# Patient Record
Sex: Male | Born: 1980 | Race: White | Hispanic: No | Smoking: Former smoker
Health system: Southern US, Community
[De-identification: ages and names within clinical notes are randomized; demographics above are authoritative.]

## PROBLEM LIST (undated history)

## (undated) DIAGNOSIS — G44009 Cluster headache syndrome, unspecified, not intractable: Secondary | ICD-10-CM

## (undated) DIAGNOSIS — K200 Eosinophilic esophagitis: Secondary | ICD-10-CM

## (undated) HISTORY — DX: Cluster headache syndrome, unspecified, not intractable: G44.009

## (undated) HISTORY — DX: Eosinophilic esophagitis: K20.0

---

## 2015-02-16 ENCOUNTER — Encounter: Payer: Self-pay | Admitting: Podiatry

## 2015-02-16 ENCOUNTER — Ambulatory Visit (INDEPENDENT_AMBULATORY_CARE_PROVIDER_SITE_OTHER): Admitting: Podiatry

## 2015-02-16 VITALS — BP 151/99 | HR 81 | Resp 18

## 2015-02-16 DIAGNOSIS — Z79899 Other long term (current) drug therapy: Secondary | ICD-10-CM

## 2015-02-16 DIAGNOSIS — B353 Tinea pedis: Secondary | ICD-10-CM | POA: Diagnosis not present

## 2015-02-16 DIAGNOSIS — B351 Tinea unguium: Secondary | ICD-10-CM | POA: Diagnosis not present

## 2015-02-16 LAB — CBC WITH DIFFERENTIAL/PLATELET
BASOS ABS: 0 10*3/uL (ref 0.0–0.1)
BASOS PCT: 0 % (ref 0–1)
Eosinophils Absolute: 0.2 10*3/uL (ref 0.0–0.7)
Eosinophils Relative: 2 % (ref 0–5)
HCT: 43.7 % (ref 39.0–52.0)
HEMOGLOBIN: 15.3 g/dL (ref 13.0–17.0)
Lymphocytes Relative: 23 % (ref 12–46)
Lymphs Abs: 2 10*3/uL (ref 0.7–4.0)
MCH: 31.4 pg (ref 26.0–34.0)
MCHC: 35 g/dL (ref 30.0–36.0)
MCV: 89.7 fL (ref 78.0–100.0)
MPV: 9.3 fL (ref 8.6–12.4)
Monocytes Absolute: 0.9 10*3/uL (ref 0.1–1.0)
Monocytes Relative: 10 % (ref 3–12)
NEUTROS ABS: 5.6 10*3/uL (ref 1.7–7.7)
NEUTROS PCT: 65 % (ref 43–77)
Platelets: 213 10*3/uL (ref 150–400)
RBC: 4.87 MIL/uL (ref 4.22–5.81)
RDW: 13.6 % (ref 11.5–15.5)
WBC: 8.6 10*3/uL (ref 4.0–10.5)

## 2015-02-16 LAB — HEPATIC FUNCTION PANEL
ALK PHOS: 42 U/L (ref 40–115)
ALT: 26 U/L (ref 9–46)
AST: 34 U/L (ref 10–40)
Albumin: 4.4 g/dL (ref 3.6–5.1)
BILIRUBIN DIRECT: 0.2 mg/dL (ref <=0.2)
BILIRUBIN TOTAL: 0.8 mg/dL (ref 0.2–1.2)
Indirect Bilirubin: 0.6 mg/dL (ref 0.2–1.2)
Total Protein: 6.5 g/dL (ref 6.1–8.1)

## 2015-02-16 MED ORDER — TERBINAFINE HCL 250 MG PO TABS: 250.0000 mg | ORAL_TABLET | Freq: Every day | ORAL | Status: DC

## 2015-02-16 NOTE — Patient Instructions (Signed)

## 2015-02-16 NOTE — Progress Notes (Signed)
   Subjective:    Patient ID: Tristan Davenport, male    DOB: Jan 21, 1981, 34 y.o.   MRN: [id number]  HPI  34 year old male presents the also for concerns of toenail fungus as well as thickening to his toenails. He denies any pain to the toenails and denies any surrounding redness or drainage. He previously was on a medicine for about 1 week previously. He discontinued the medicine although he was not having any side effects of the medicine. Since been over several years ago. He states his feet used it quite a bit however that subsided but he does continue to have a rash in the bottom of both of his feet. No open lesions or sores or any drainage. No other complaints at this time.   Review of Systems  All other systems reviewed and are negative.      Objective:   Physical Exam General: AAO x3, NAD  Dermatological: Nails are hypertrophic, dystrophic, brittle, discolored protective of the bilateral hallux as well as second digit toenails. There is also dry, scaly, erythematous rash interdigitally as well as the plantar aspect of the foot. There is no drainage or pus or any signs of infection.  Vascular: Dorsalis Pedis artery and Posterior Tibial artery pedal pulses are 2/4 bilateral with immedate capillary fill time. Pedal hair growth present. No varicosities and no lower extremity edema present bilateral. There is no pain with calf compression, swelling, warmth, erythema.   Neruologic: Grossly intact via light touch bilateral. Vibratory intact via tuning fork bilateral. Protective threshold with Semmes Wienstein monofilament intact to all pedal sites bilateral. Patellar and Achilles deep tendon reflexes 2+ bilateral. No Babinski or clonus noted bilateral.   Musculoskeletal: No gross boney pedal deformities bilateral. No pain, crepitus, or limitation noted with foot and ankle range of motion bilateral. Muscular strength 5/5 in all groups tested bilateral.  Gait: Unassisted, Nonantalgic.        Assessment & Plan:  34 year old male with onychodystrophy, onychomycosis/tinea pedis -Treatment options discussed including all alternatives, risks, and complications -At today's appointment the nails were biopsied and [facility] of onychomycosis. -Discussed nail removal however they are not painful at this time or infected. He wishes to hold off on nail removal. He was inquiring about this however. -Due to the tinea pedis we'll go ahead and start Lamisil. Prescription for 30 days of Lamisil as well as blood work was given the patient. Do not start the pills until I call him with the results the blood work. Discussed with him that he should not drink while taking this medicine. I also discussed side effects the medicine to stop and call the office should any occur. -If the culture changes the course of therapy I will call the patient however due to the amount of tinea pedis will come and start the Lamisil. -Follow-up 4 weeks after starting the lamisil or sooner if any problems arise. In the meantime, encouraged to call the office with any questions, concerns, change in symptoms.   Ovid CurdMatthew Wagoner, DPM

## 2015-02-18 ENCOUNTER — Encounter: Payer: Self-pay | Admitting: Podiatry

## 2015-02-18 DIAGNOSIS — B353 Tinea pedis: Secondary | ICD-10-CM | POA: Insufficient documentation

## 2015-02-19 ENCOUNTER — Telehealth: Payer: Self-pay | Admitting: *Deleted

## 2015-02-19 NOTE — Telephone Encounter (Addendum)
-----   Message from Vivi BarrackMatthew R Wagoner, DPM sent at 02/17/2015 11:02 AM EST ----- OK to start lamisil. Blood work normal. Please let him know. Thanks. Left message instructing pt to begin medication.

## 2015-03-14 ENCOUNTER — Ambulatory Visit
Admission: RE | Admit: 2015-03-14 | Discharge: 2015-03-14 | Disposition: A | Discharge status: Home / Self Care | Payer: No Typology Code available for payment source | Source: Ambulatory Visit | Attending: Occupational Medicine | Admitting: Occupational Medicine

## 2015-03-14 ENCOUNTER — Other Ambulatory Visit: Payer: Self-pay | Admitting: Occupational Medicine

## 2015-03-14 DIAGNOSIS — Z09 Encounter for follow-up examination after completed treatment for conditions other than malignant neoplasm: Secondary | ICD-10-CM

## 2015-03-16 ENCOUNTER — Encounter: Payer: Self-pay | Admitting: Podiatry

## 2015-03-16 ENCOUNTER — Ambulatory Visit (INDEPENDENT_AMBULATORY_CARE_PROVIDER_SITE_OTHER): Admitting: Podiatry

## 2015-03-16 VITALS — BP 124/90 | HR 63 | Resp 18

## 2015-03-16 DIAGNOSIS — B351 Tinea unguium: Secondary | ICD-10-CM

## 2015-03-16 DIAGNOSIS — Z79899 Other long term (current) drug therapy: Secondary | ICD-10-CM | POA: Diagnosis not present

## 2015-03-16 LAB — CBC WITH DIFFERENTIAL/PLATELET
BASOS ABS: 0 10*3/uL (ref 0.0–0.1)
BASOS PCT: 0 % (ref 0–1)
EOS ABS: 0.2 10*3/uL (ref 0.0–0.7)
Eosinophils Relative: 3 % (ref 0–5)
HCT: 47.3 % (ref 39.0–52.0)
HEMOGLOBIN: 16.6 g/dL (ref 13.0–17.0)
Lymphocytes Relative: 32 % (ref 12–46)
Lymphs Abs: 1.9 10*3/uL (ref 0.7–4.0)
MCH: 31.2 pg (ref 26.0–34.0)
MCHC: 35.1 g/dL (ref 30.0–36.0)
MCV: 88.9 fL (ref 78.0–100.0)
MONOS PCT: 8 % (ref 3–12)
MPV: 9.1 fL (ref 8.6–12.4)
Monocytes Absolute: 0.5 10*3/uL (ref 0.1–1.0)
NEUTROS ABS: 3.4 10*3/uL (ref 1.7–7.7)
NEUTROS PCT: 57 % (ref 43–77)
PLATELETS: 237 10*3/uL (ref 150–400)
RBC: 5.32 MIL/uL (ref 4.22–5.81)
RDW: 13.4 % (ref 11.5–15.5)
WBC: 6 10*3/uL (ref 4.0–10.5)

## 2015-03-16 LAB — HEPATIC FUNCTION PANEL
ALBUMIN: 4.6 g/dL (ref 3.6–5.1)
ALK PHOS: 40 U/L (ref 40–115)
ALT: 26 U/L (ref 9–46)
AST: 27 U/L (ref 10–40)
BILIRUBIN DIRECT: 0.1 mg/dL (ref <=0.2)
BILIRUBIN INDIRECT: 0.4 mg/dL (ref 0.2–1.2)
BILIRUBIN TOTAL: 0.5 mg/dL (ref 0.2–1.2)
Total Protein: 7 g/dL (ref 6.1–8.1)

## 2015-03-16 LAB — BASIC METABOLIC PANEL
BUN: 17 mg/dL (ref 7–25)
CALCIUM: 9.7 mg/dL (ref 8.6–10.3)
CHLORIDE: 100 mmol/L (ref 98–110)
CO2: 28 mmol/L (ref 20–31)
CREATININE: 1.14 mg/dL (ref 0.60–1.35)
GLUCOSE: 78 mg/dL (ref 65–99)
Potassium: 4.9 mmol/L (ref 3.5–5.3)
Sodium: 138 mmol/L (ref 135–146)

## 2015-03-16 MED ORDER — TERBINAFINE HCL 250 MG PO TABS: 250.0000 mg | ORAL_TABLET | Freq: Every day | ORAL | Status: DC

## 2015-03-19 ENCOUNTER — Telehealth: Payer: Self-pay | Admitting: *Deleted

## 2015-03-19 ENCOUNTER — Encounter: Payer: Self-pay | Admitting: Podiatry

## 2015-03-19 DIAGNOSIS — B351 Tinea unguium: Secondary | ICD-10-CM | POA: Insufficient documentation

## 2015-03-19 NOTE — Progress Notes (Signed)
Patient ID: Starla LinkJeffrey Leavey, male   DOB: May 29, 1980, 34 y.o.   MRN: [id number]  Subjective: 34 year old male presents the office today 4 weeks of the started Lamisil. He states that other than being thirsty he has had no other concerns of the medication. He states he can tell a difference with the medicine. Denies any pain that he can see evidence of clearing of the nails. No drainage from the nails or any redness. No other complaints.  Objective: AAO 3, NAD Neurovascular status intact There does appear to be hypertrophy, dystrophy, discoloration, brittleness the nails distally however there is clearing the nails proximally with approximately 25% the nail cleared proximally. There is no tenderness palpation of the nails and no swelling erythema or drainage. No open lesions or pre-ulcerative lesions. No pain with cal compression, swelling, warmth, erythema.  Assessment: 34 year old male with resolving onychomycosis  Plan: -Treatment options discussed including all alternatives, risks, and complications -At this time discussed other side effects of Lamisil. He wishes to continue the Lamisil. Prescription for blood work was also given to him today as well as for 60 days and Lamisil. Encouraged him not to take the Lamisil and I call him with results of the blood work. -Follow-up in 2 months or sooner if any problems arise. In the meantime, encouraged to call the office with any questions, concerns, change in symptoms.   Ovid CurdMatthew Curvin Hunger, DPM

## 2015-03-19 NOTE — Telephone Encounter (Addendum)
-----   Message from Vivi BarrackMatthew R Wagoner, DPM sent at 03/19/2015  7:54 AM EST ----- All labs normal. Can continue lamisil. Please let him know. Thanks. Informed pt of results.

## 2015-04-30 ENCOUNTER — Ambulatory Visit (INDEPENDENT_AMBULATORY_CARE_PROVIDER_SITE_OTHER): Admitting: Podiatry

## 2015-04-30 ENCOUNTER — Encounter: Payer: Self-pay | Admitting: Podiatry

## 2015-04-30 VITALS — BP 171/105 | HR 87 | Resp 18

## 2015-04-30 DIAGNOSIS — Z79899 Other long term (current) drug therapy: Secondary | ICD-10-CM

## 2015-04-30 DIAGNOSIS — B351 Tinea unguium: Secondary | ICD-10-CM | POA: Diagnosis not present

## 2015-04-30 MED ORDER — TERBINAFINE HCL 250 MG PO TABS: 250.0000 mg | ORAL_TABLET | Freq: Every day | ORAL | Status: DC

## 2015-04-30 NOTE — Progress Notes (Signed)
Patient ID: Tristan Davenport, male   DOB: 1980-09-23, 35 y.o.   MRN: [id number]  Subjective: 35 year old male presents the office today 4 weeks of the started Lamisil. He states he said no side effects the medication. The thirstiness that he is having previously has resolved. He states that he has noticed significant improvement in his nails. Denies any pain or any ascending redness or drainage. No other complaints.  Objective: AAO 3, NAD Neurovascular status intact There does appear to be hypertrophy, dystrophy, discoloration, brittleness the nails distally however there is clearing the nails proximally with approximately 50% the nail cleared proximally. There is no tenderness palpation of the nails and no swelling erythema or drainage. No open lesions or pre-ulcerative lesions. No pain with cal compression, swelling, warmth, erythema.  Assessment: 35 year old male with resolving onychomycosis  Plan: -Treatment options discussed including all alternatives, risks, and complications -At this time discussed other side effects of Lamisil. He wishes to continue the Lamisil. We'll continue Lamisil for 4 months. We'll repeat blood work as well as the perception for 30 days of Lamisil was dispensed. Recommend he get the blood work about one week before starting the fourth month. Continue to monitor for side effects. -Due to his schedule see him back in the next couple months. In the meantime call the office with any questions, concerns or any change in symptoms.  Ovid Curd, DPM

## 2016-03-31 DIAGNOSIS — K200 Eosinophilic esophagitis: Secondary | ICD-10-CM

## 2016-03-31 HISTORY — DX: Eosinophilic esophagitis: K20.0

## 2016-04-28 ENCOUNTER — Emergency Department (HOSPITAL_COMMUNITY)
Admission: EM | Admit: 2016-04-28 | Discharge: 2016-04-28 | Disposition: A | Discharge status: Home / Self Care | Payer: No Typology Code available for payment source | Attending: Dermatology | Admitting: Dermatology

## 2016-04-28 ENCOUNTER — Encounter (HOSPITAL_COMMUNITY): Payer: Self-pay | Admitting: Emergency Medicine

## 2016-04-28 DIAGNOSIS — Z5321 Procedure and treatment not carried out due to patient leaving prior to being seen by health care provider: Secondary | ICD-10-CM | POA: Insufficient documentation

## 2016-04-28 DIAGNOSIS — R1310 Dysphagia, unspecified: Secondary | ICD-10-CM | POA: Insufficient documentation

## 2016-04-28 NOTE — ED Triage Notes (Signed)
Pt states he was eating steak and "it went down the wrong pipe"; pt states he was having respiratory distress until he was able to vomit; pt stated the respiratory distress recurred and he vomited again; pt states he has had some difficulties with swallowing that are new in the past few weeks; A&Ox4; no distress at this time; breathing freely and maintaining O2 sats

## 2016-04-28 NOTE — ED Notes (Signed)
Pt informed registration he is leaving facility

## 2016-05-05 ENCOUNTER — Ambulatory Visit: Payer: No Typology Code available for payment source | Admitting: Physician Assistant

## 2016-05-06 ENCOUNTER — Ambulatory Visit (INDEPENDENT_AMBULATORY_CARE_PROVIDER_SITE_OTHER): Payer: Commercial Managed Care - HMO | Admitting: Physician Assistant

## 2016-05-06 ENCOUNTER — Encounter: Payer: Self-pay | Admitting: Physician Assistant

## 2016-05-06 VITALS — BP 155/100 | HR 90 | Temp 98.5°F | Resp 20 | Ht 72.0 in | Wt 200.0 lb

## 2016-05-06 DIAGNOSIS — Z1339 Encounter for screening examination for other mental health and behavioral disorders: Secondary | ICD-10-CM

## 2016-05-06 DIAGNOSIS — Z0001 Encounter for general adult medical examination with abnormal findings: Secondary | ICD-10-CM

## 2016-05-06 DIAGNOSIS — R1310 Dysphagia, unspecified: Secondary | ICD-10-CM | POA: Diagnosis not present

## 2016-05-06 DIAGNOSIS — Z789 Other specified health status: Secondary | ICD-10-CM | POA: Diagnosis not present

## 2016-05-06 DIAGNOSIS — Z1389 Encounter for screening for other disorder: Secondary | ICD-10-CM | POA: Diagnosis not present

## 2016-05-06 DIAGNOSIS — E663 Overweight: Secondary | ICD-10-CM | POA: Diagnosis not present

## 2016-05-06 DIAGNOSIS — N50819 Testicular pain, unspecified: Secondary | ICD-10-CM | POA: Diagnosis not present

## 2016-05-06 DIAGNOSIS — R030 Elevated blood-pressure reading, without diagnosis of hypertension: Secondary | ICD-10-CM | POA: Diagnosis not present

## 2016-05-06 DIAGNOSIS — Z23 Encounter for immunization: Secondary | ICD-10-CM

## 2016-05-06 DIAGNOSIS — Z0000 Encounter for general adult medical examination without abnormal findings: Secondary | ICD-10-CM | POA: Diagnosis not present

## 2016-05-06 LAB — CBC WITH DIFFERENTIAL/PLATELET
BASOS PCT: 0.8 % (ref 0.0–3.0)
Basophils Absolute: 0 10*3/uL (ref 0.0–0.1)
EOS PCT: 3.1 % (ref 0.0–5.0)
Eosinophils Absolute: 0.2 10*3/uL (ref 0.0–0.7)
HEMATOCRIT: 46.9 % (ref 39.0–52.0)
HEMOGLOBIN: 16.3 g/dL (ref 13.0–17.0)
LYMPHS PCT: 22.6 % (ref 12.0–46.0)
Lymphs Abs: 1.3 10*3/uL (ref 0.7–4.0)
MCHC: 34.9 g/dL (ref 30.0–36.0)
MCV: 91.5 fl (ref 78.0–100.0)
MONOS PCT: 9.2 % (ref 3.0–12.0)
Monocytes Absolute: 0.5 10*3/uL (ref 0.1–1.0)
Neutro Abs: 3.6 10*3/uL (ref 1.4–7.7)
Neutrophils Relative %: 64.3 % (ref 43.0–77.0)
PLATELETS: 234 10*3/uL (ref 150.0–400.0)
RBC: 5.12 Mil/uL (ref 4.22–5.81)
RDW: 13.3 % (ref 11.5–15.5)
WBC: 5.6 10*3/uL (ref 4.0–10.5)

## 2016-05-06 LAB — LIPID PANEL
CHOL/HDL RATIO: 3
Cholesterol: 222 mg/dL — ABNORMAL HIGH (ref 0–200)
HDL: 73.6 mg/dL (ref >39.00)
LDL Cholesterol: 135 mg/dL — ABNORMAL HIGH (ref 0–99)
NONHDL: 148.5
TRIGLYCERIDES: 68 mg/dL (ref 0.0–149.0)
VLDL: 13.6 mg/dL (ref 0.0–40.0)

## 2016-05-06 LAB — COMPREHENSIVE METABOLIC PANEL
ALBUMIN: 5 g/dL (ref 3.5–5.2)
ALT: 29 U/L (ref 0–53)
AST: 27 U/L (ref 0–37)
Alkaline Phosphatase: 47 U/L (ref 39–117)
BUN: 22 mg/dL (ref 6–23)
CALCIUM: 9.8 mg/dL (ref 8.4–10.5)
CHLORIDE: 106 meq/L (ref 96–112)
CO2: 26 meq/L (ref 19–32)
CREATININE: 1.1 mg/dL (ref 0.40–1.50)
GFR: 79.79 mL/min (ref >60.00)
Glucose, Bld: 88 mg/dL (ref 70–99)
Potassium: 4.4 mEq/L (ref 3.5–5.1)
Sodium: 137 mEq/L (ref 135–145)
Total Bilirubin: 0.6 mg/dL (ref 0.2–1.2)
Total Protein: 7.3 g/dL (ref 6.0–8.3)

## 2016-05-06 NOTE — Progress Notes (Signed)
Pre visit review using our clinic review tool, if applicable. No additional management support is needed unless otherwise documented below in the visit note. 

## 2016-05-06 NOTE — Patient Instructions (Addendum)
It was great meeting you today!  You will be called about scheduling your testicular ultrasound and your gastroenterology referral. Please avoid difficult to chew foods, as we discussed until cleared by gastroenterology. If you develop any difficulty breathing, please go to the emergency room immediately!  Please make an appointment to follow-up with me in two weeks for your blood pressure. Work on stress management if you can!   Health Maintenance, Male A healthy lifestyle and preventative care can promote health and wellness.  Maintain regular health, dental, and eye exams.  Eat a healthy diet. Foods like vegetables, fruits, whole grains, low-fat dairy products, and lean protein foods contain the nutrients you need and are low in calories. Decrease your intake of foods high in solid fats, added sugars, and salt. Get information about a proper diet from your health care provider, if necessary.  Regular physical exercise is one of the most important things you can do for your health. Most adults should get at least 150 minutes of moderate-intensity exercise (any activity that increases your heart rate and causes you to sweat) each week. In addition, most adults need muscle-strengthening exercises on 2 or more days a week.   Maintain a healthy weight. The body mass index (BMI) is a screening tool to identify possible weight problems. It provides an estimate of body fat based on height and weight. Your health care provider can find your BMI and can help you achieve or maintain a healthy weight. For males 20 years and older:  A BMI below 18.5 is considered underweight.  A BMI of 18.5 to 24.9 is normal.  A BMI of 25 to 29.9 is considered overweight.  A BMI of 30 and above is considered obese.  Maintain normal blood lipids and cholesterol by exercising and minimizing your intake of saturated fat. Eat a balanced diet with plenty of fruits and vegetables. Blood tests for lipids and cholesterol  should begin at age 12 and be repeated every 5 years. If your lipid or cholesterol levels are high, you are over age 39, or you are at high risk for heart disease, you may need your cholesterol levels checked more frequently.Ongoing high lipid and cholesterol levels should be treated with medicines if diet and exercise are not working.  If you smoke, find out from your health care provider how to quit. If you do not use tobacco, do not start.  Lung cancer screening is recommended for adults aged 55-80 years who are at high risk for developing lung cancer because of a history of smoking. A yearly low-dose CT scan of the lungs is recommended for people who have at least a 30-pack-year history of smoking and are current smokers or have quit within the past 15 years. A pack year of smoking is smoking an average of 1 pack of cigarettes a day for 1 year (for example, a 30-pack-year history of smoking could mean smoking 1 pack a day for 30 years or 2 packs a day for 15 years). Yearly screening should continue until the smoker has stopped smoking for at least 15 years. Yearly screening should be stopped for people who develop a health problem that would prevent them from having lung cancer treatment.  If you choose to drink alcohol, do not have more than 2 drinks per day. One drink is considered to be 12 oz (360 mL) of beer, 5 oz (150 mL) of wine, or 1.5 oz (45 mL) of liquor.  Avoid the use of street drugs. Do not  share needles with anyone. Ask for help if you need support or instructions about stopping the use of drugs.  High blood pressure causes heart disease and increases the risk of stroke. High blood pressure is more likely to develop in:  People who have blood pressure in the end of the normal range (100-139/85-89 mm Hg).  People who are overweight or obese.  People who are African American.  If you are 5918-38 years of age, have your blood pressure checked every 3-5 years. If you are 38 years of age  or older, have your blood pressure checked every year. You should have your blood pressure measured twice-once when you are at a hospital or clinic, and once when you are not at a hospital or clinic. Record the average of the two measurements. To check your blood pressure when you are not at a hospital or clinic, you can use:  An automated blood pressure machine at a pharmacy.  A home blood pressure monitor.  If you are 3945-38 years old, ask your health care provider if you should take aspirin to prevent heart disease.  Diabetes screening involves taking a blood sample to check your fasting blood sugar level. This should be done once every 3 years after age 38 if you are at a normal weight and without risk factors for diabetes. Testing should be considered at a younger age or be carried out more frequently if you are overweight and have at least 1 risk factor for diabetes.  Colorectal cancer can be detected and often prevented. Most routine colorectal cancer screening begins at the age of 38 and continues through age 38. However, your health care provider may recommend screening at an earlier age if you have risk factors for colon cancer. On a yearly basis, your health care provider may provide home test kits to check for hidden blood in the stool. A small camera at the end of a tube may be used to directly examine the colon (sigmoidoscopy or colonoscopy) to detect the earliest forms of colorectal cancer. Talk to your health care provider about this at age 38 when routine screening begins. A direct exam of the colon should be repeated every 5-10 years through age 38, unless early forms of precancerous polyps or small growths are found.  People who are at an increased risk for hepatitis B should be screened for this virus. You are considered at high risk for hepatitis B if:  You were born in a country where hepatitis B occurs often. Talk with your health care provider about which countries are considered  high risk.  Your parents were born in a high-risk country and you have not received a shot to protect against hepatitis B (hepatitis B vaccine).  You have HIV or AIDS.  You use needles to inject street drugs.  You live with, or have sex with, someone who has hepatitis B.  You are a man who has sex with other men (MSM).  You get hemodialysis treatment.  You take certain medicines for conditions like cancer, organ transplantation, and autoimmune conditions.  Hepatitis C blood testing is recommended for all people born from [id number] through 1965 and any individual with known risk factors for hepatitis C.  Healthy men should no longer receive prostate-specific antigen (PSA) blood tests as part of routine cancer screening. Talk to your health care provider about prostate cancer screening.  Testicular cancer screening is not recommended for adolescents or adult males who have no symptoms. Screening includes self-exam, a health  care provider exam, and other screening tests. Consult with your health care provider about any symptoms you have or any concerns you have about testicular cancer.  Practice safe sex. Use condoms and avoid high-risk sexual practices to reduce the spread of sexually transmitted infections (STIs).  You should be screened for STIs, including gonorrhea and chlamydia if:  You are sexually active and are younger than 24 years.  You are older than 24 years, and your health care provider tells you that you are at risk for this type of infection.  Your sexual activity has changed since you were last screened, and you are at an increased risk for chlamydia or gonorrhea. Ask your health care provider if you are at risk.  If you are at risk of being infected with HIV, it is recommended that you take a prescription medicine daily to prevent HIV infection. This is called pre-exposure prophylaxis (PrEP). You are considered at risk if:  You are a man who has sex with other men  (MSM).  You are a heterosexual man who is sexually active with multiple partners.  You take drugs by injection.  You are sexually active with a partner who has HIV.  Talk with your health care provider about whether you are at high risk of being infected with HIV. If you choose to begin PrEP, you should first be tested for HIV. You should then be tested every 3 months for as long as you are taking PrEP.  Use sunscreen. Apply sunscreen liberally and repeatedly throughout the day. You should seek shade when your shadow is shorter than you. Protect yourself by wearing long sleeves, pants, a wide-brimmed hat, and sunglasses year round whenever you are outdoors.  Tell your health care provider of new moles or changes in moles, especially if there is a change in shape or color. Also, tell your health care provider if a mole is larger than the size of a pencil eraser.  A one-time screening for abdominal aortic aneurysm (AAA) and surgical repair of large AAAs by ultrasound is recommended for men aged 65-75 years who are current or former smokers.  Stay current with your vaccines (immunizations). This information is not intended to replace advice given to you by your health care provider. Make sure you discuss any questions you have with your health care provider. Document Released: 09/13/2007 Document Revised: 04/07/2014 Document Reviewed: 12/19/2014 Elsevier Interactive Patient Education  2017 [employer].

## 2016-05-06 NOTE — Progress Notes (Signed)
Subjective:    Patient ID: Tristan Davenport, male    DOB: 1980/05/25, 36 y.o.   MRN: [id number]  HPI  Mr. Tristan Davenport is a 36 y/o male who presents to establish care.   Acute concerns 1) Choking - he is having some issues with "choking" x 2 episodes. Of note, he went to the ED on 04/28/2016 after an episode where he was eating steak and "it went down the wrong pipe."  During that episode he reports that he was having difficulty breathing until he was able to vomit. This is the second time that this has happened, the first time it was with sausage.  He does not have issues with liquids. He has noticed an increase in difficulty swallowing x 2 months. He denies any weight loss or symptoms suggesting anemia (lightheadedness, dizziness.) He does state that he has had some recent (but not current) issues with external hemorrhoids, and had some BRBPR a few months ago, but that has resolved. He does also note that he thinks he is developing food allergies to soy, avocado and beans, he states that over the past few months/weeks he has noticed when he eats these foods he feels like his throat is itchy and sometimes he feels like his throat may "close." 2) "Groin issue" - he states that he has had some intermittent discomfort and fullness to his R testicle x 2 years. Denies trauma. Denies pain with lifting or any discoloration. Describes his R scrotum as feeling like "spaghetti." He has not felt any masses.  Chronic Issues: 1) Hypertension -- he was diagnosed with HTN at age 89. He was on medications for 3 weeks, but the medicine did not make him feel good, he does not remember how he felt or what the name of the medication was; he states that it was 141/91 last week when checked by someone at work. He tells me that he is very against medications for HTN. He denies changes in vision, unusual chest pain, swelling in his legs, SOB. 2) Alcohol abuse -  he tells me that he used to be an alcoholic, but never went to  rehab and was able to decrease his alcohol intake without any professional help; he tells me now that he drinks at least 12 drinks a week, drinks sctoch, rum, or gin; tells me that no one has ever told him to cut down on his drinking and that he has no concerns with it at this time 3) Hx of cluster headaches - it has been at least 3-5 years since his last cluster HA  Health Maintenance: Immunizations -- due for tetanus, he will bring records Diet -- well balanced, thinking of going to paleo soon Exercise -- 6-7 a days a week, weights and cardio Weight -- Weight: 200 lb (90.7 kg) , 195 lb a week ago (muscle growth), 185 lb 1 year ago Mood -- does have mild depression  Other providers/specialists: Dentist -- he has routine check-up, does not know the name of his doctor No eye doctor - lasik 2005 Chiropractor - sprained back in July, sees chiropractor for this   Review of Systems  Constitutional: Negative for fatigue and fever.  HENT: Negative for postnasal drip, sneezing and sore throat.   Eyes: Negative for visual disturbance.  Respiratory: Positive for choking. Negative for cough, chest tightness and shortness of breath.   Cardiovascular: Negative for chest pain, palpitations and leg swelling.  Gastrointestinal: Negative for diarrhea and rectal pain.  Genitourinary: Positive for scrotal swelling  and testicular pain. Negative for difficulty urinating, discharge and hematuria.  Musculoskeletal: Negative for myalgias.  Neurological: Negative for dizziness, seizures and weakness.  Hematological: Negative for adenopathy.  Psychiatric/Behavioral: Negative for decreased concentration and sleep disturbance. The patient is not nervous/anxious.        Denies issues with anxiety   Past Medical History:  Diagnosis Date  . Cluster headaches    last one was 5 year  . Hypertension      Social History   Social History  . Marital status: Divorced    Spouse name: N/A  . Number of children:  N/A  . Years of education: N/A   Occupational History  . Not on file.   Social History Main Topics  . Smoking status: Former Smoker    Types: Cigarettes    Quit date: 05/07/2007  . Smokeless tobacco: Former Neurosurgeon    Types: Snuff    Quit date: 05/06/2013  . Alcohol use 7.2 oz/week    12 Shots of liquor per week  . Drug use: No  . Sexual activity: Yes    Partners: Female   Other Topics Concern  . 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  Family History  Problem Relation Age of Onset  . Hypertension Father   . Anorexia nervosa Sister   . Parkinson's disease Maternal Grandfather   . Emphysema Paternal Grandfather     Allergies  Allergen Reactions  . Penicillins     No current outpatient prescriptions on file prior to visit.   No current facility-administered medications on file prior to visit.     BP (!) 160/100 (BP Location: Left Arm, Patient Position: Sitting, Cuff Size: Large)   Pulse 90   Temp 98.5 F (36.9 C) (Oral)   Resp 20   Ht 6' (1.829 m)   Wt 200 lb (90.7 kg)   SpO2 97%   BMI 27.12 kg/m        Objective:   Physical Exam  Constitutional: He appears well-developed and well-nourished. He is cooperative.  Non-toxic appearance. He does not have a sickly appearance. He does not appear ill.  HENT:  Head: Normocephalic and atraumatic.  Right Ear: Tympanic membrane, external ear and ear canal normal. Tympanic membrane is not erythematous, not retracted and not bulging.  Left Ear: Tympanic membrane, external ear and ear canal normal. Tympanic membrane is not erythematous, not retracted and not bulging.  Nose: Nose normal. Right sinus exhibits no maxillary sinus tenderness and no frontal sinus tenderness. Left sinus exhibits no maxillary sinus tenderness and no frontal  sinus tenderness.  Mouth/Throat: Uvula is midline. No posterior oropharyngeal edema or posterior oropharyngeal erythema.  Cardiovascular: Normal rate, regular rhythm and normal heart sounds.   Pulmonary/Chest: Effort normal and breath sounds normal. No accessory muscle usage. No respiratory distress.  Abdominal: Soft. Normal appearance and bowel sounds are normal. There is no tenderness. There is no rigidity, no rebound and no guarding.  Genitourinary: Testes normal and penis normal. Right testis shows no mass, no swelling and no tenderness. Left testis shows no mass, no swelling and no tenderness.  Genitourinary Comments: No obvious deformities or enlargement of R testicle; no masses found on either testicle with palpation  Musculoskeletal: Normal range of motion.  Lymphadenopathy:  He has no cervical adenopathy.  Neurological: He is alert. No cranial nerve deficit or sensory deficit. GCS eye subscore is 4. GCS verbal subscore is 5. GCS motor subscore is 6.  Skin: Skin is warm, dry and intact.  Psychiatric: He has a normal mood and affect. His speech is normal and behavior is normal.  Nursing note and vitals reviewed.     Assessment & Plan:  1. Routine adult health maintenance Today patient counseled on age appropriate routine health concerns for screening and prevention, each reviewed and up to date or declined. Immunizations reviewed and up to date or declined. Labs ordered and reviewed. Risk factors for depression reviewed and negative. Hearing function and visual acuity are intact. ADLs screened and addressed as needed. Functional ability and level of safety reviewed and appropriate. Education, counseling and referrals performed based on assessed risks today. Patient provided with a copy of personalized plan for preventive services. - CBC with Differential/Platelet - Lipid panel - Comprehensive metabolic panel - HIV antibody  2. Dysphagia, unspecified type Discussed possible causes.  Recommend referral to GI for further evaluation and treatment. Discouraged further consumption of difficult to chew foods, as well as food products containing those that cause itchiness or any swelling sensation in his throat. - Ambulatory referral to Gastroenterology  3. Testicular discomfort No obvious issues on my exam, however I would like for an ultrasound to be completed given duration of symptoms and intermittent discomfort. Patient agreeable. - US Scrotum; Future - [place] Art/Ven Flow Abd Pelv Doppler; Future  4. Need for prophylactic vaccination with combined diphtheria-tetanus-pertussis (DTP) vaccine Tdap vaccine greater than or equal to 7yo IM  5. Overweight (BMI 25.0-29.9) Patient with muscular body habitus, I encouraged continued exercise and well-balanced meals.  6. Elevated blood pressure reading Patient to follow-up with me in two weeks for blood pressure re-check, sooner if other concerns or problems arise. Patient advised to ED if he develops any sudden onset SOB, chest pain, or other worrisome signs. He is agreeable to considering BP meds if needed.  7. Alcohol consumption of 1-4 drinks per day on alcohol screening Patient does not find that he currently has a problem with his alcohol intake. He does endorse significant life stress. Will continue to monitor. Encouraged patient to follow-up with [place] if alcohol intake increases or becomes problematic. Will address at future visit.  Jarold Motto PA-C 05/07/16

## 2016-05-07 ENCOUNTER — Encounter: Payer: Self-pay | Admitting: Physician Assistant

## 2016-05-07 LAB — HIV ANTIBODY (ROUTINE TESTING W REFLEX): HIV: NONREACTIVE

## 2016-05-09 ENCOUNTER — Encounter: Payer: Self-pay | Admitting: Nurse Practitioner

## 2016-05-09 ENCOUNTER — Ambulatory Visit (INDEPENDENT_AMBULATORY_CARE_PROVIDER_SITE_OTHER): Payer: Commercial Managed Care - HMO | Admitting: Nurse Practitioner

## 2016-05-09 VITALS — BP 112/62 | HR 88 | Ht 72.0 in | Wt 202.0 lb

## 2016-05-09 DIAGNOSIS — R1310 Dysphagia, unspecified: Secondary | ICD-10-CM | POA: Diagnosis not present

## 2016-05-09 MED ORDER — OMEPRAZOLE 40 MG PO CPDR: 40.0000 mg | DELAYED_RELEASE_CAPSULE | ORAL | 3 refills | Status: DC

## 2016-05-09 NOTE — Patient Instructions (Addendum)
If you are age 38 or older, your body mass index should be between 23-30. Your Body mass index is 27.4 kg/m. If this is out of the aforementioned range listed, please consider follow up with your Primary Care Provider.  If you are age 164 or younger, your body mass index should be between 19-25. Your Body mass index is 27.4 kg/m. If this is out of the aformentioned range listed, please consider follow up with your Primary Care Provider.   You have been scheduled for an endoscopy. Please follow written instructions given to you at your visit today. If you use inhalers (even only as needed), please bring them with you on the day of your procedure. Your physician has requested that you go to www.startemmi.com and enter the access code given to you at your visit today. This web site gives a general overview about your procedure. However, you should still follow specific instructions given to you by our office regarding your preparation for the procedure.   Thank you.

## 2016-05-09 NOTE — Progress Notes (Addendum)
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 Esophagus starts to fees tight when eating then it's hard to get solids or liquids down. This is been happening 2-3 times a week  Over the last couple of months. In retrospect he recalls occasional dysphagia to solids for a very long time. He had a recent episode of severe dysphagia when steak lodged in his esophagus resulting in difficulty breathing. Patient went to ED but the wait was long and he eventually vomiting up the steak anyway. Patient took Zantac for transient GERD symptoms years ago. No chest pain. No known pulmonary disease.  He drinks about 3 ETOH beverages a day several days a week. He is trying to cut back on alcohol intake. Recent LFTs were normal.   Past Medical History:  Diagnosis Date  . Cluster headaches    last one was 5 year  . Hypertension     History reviewed. No pertinent surgical history. Family History  Problem Relation Age of Onset  . Stroke Mother 23  . Hypertension Father   . Anorexia nervosa Sister   . Parkinson's disease Maternal Grandfather   . Emphysema Paternal Grandfather   . Colon cancer Neg Hx   . Stomach cancer Neg Hx    Social History  Substance Use Topics  . Smoking status: Former Smoker    Types: Cigarettes    Quit date: 05/07/2007  . Smokeless tobacco: Former Neurosurgeon    Types: Snuff    Quit date: 05/06/2013  . Alcohol use 7.2 oz/week    12 Shots of liquor per week   Current Outpatient Prescriptions  Medication Sig Dispense Refill  . arginine 500 MG tablet Take 500 mg by mouth daily.    Marland Kitchen CITRULLINE PO Take 3 capsules by mouth daily. 250 mg each    . NON FORMULARY      No current facility-administered medications for this visit.    Allergies  Allergen Reactions  . Penicillins      Review of Systems: All systems reviewed and negative except where noted in HPI.    Physical Exam: BP 112/62   Pulse 88   Ht 6' (1.829 m)   Wt  202 lb (91.6 kg)   BMI 27.40 kg/m  Constitutional:  Well-developed, white male in no acute distress. Psychiatric: Normal mood and affect. Behavior is normal. HEENT: Normocephalic and atraumatic. Conjunctivae are normal. No scleral icterus. Neck supple.  Cardiovascular: Normal rate, regular rhythm.  Pulmonary/chest: Effort normal and breath sounds normal. No wheezing, rales or rhonchi. Abdominal: Soft, nondistended, nontender. Bowel sounds active throughout. There are no masses palpable. No hepatomegaly. Extremities: no edema Lymphadenopathy: No cervical adenopathy noted. Neurological: Alert and oriented to person place and time. Skin: Skin is warm and dry. No rashes noted.   ASSESSMENT AND PLAN:  29. 36 year old male with chronic, intermittent solid food dysphagia, worse over the last couple of months. Recent episode of severe dysphagia while eating steak which eventually culminated in regurgitation / vomiting of the steak. Rule out esophageal stricture, eosinophilic esophagitis, dysmotility disorder.  -Patient will be scheduled for EGD with possible dilation. He leaves for [place] in a few days and will return around 05/20/16. I was able to find a procedure time for him with Dr. Rhea Belton within a few days of his return. In the interim I will start him on Omeprazole . We discussed the importance of taking small bites of food slowly with liquids in  between each bite to avoid impaction.  2. ETOH use, more than moderate amount. LFTs normal. He is trying to cut back   Willette ClusterPaula Gabbriella Presswood, NP  05/09/2016, 11:32 AM  Cc:  Jarold MottoWorley, Samantha, GeorgiaPA   Addendum: Reviewed and agree with initial management. Beverley FiedlerJay M Pyrtle, MD

## 2016-05-22 ENCOUNTER — Ambulatory Visit (AMBULATORY_SURGERY_CENTER): Payer: Commercial Managed Care - HMO | Admitting: Internal Medicine

## 2016-05-22 ENCOUNTER — Encounter: Payer: Self-pay | Admitting: Internal Medicine

## 2016-05-22 VITALS — BP 140/78 | HR 72 | Temp 98.9°F | Resp 17 | Ht 72.0 in | Wt 202.0 lb

## 2016-05-22 DIAGNOSIS — K209 Esophagitis, unspecified without bleeding: Secondary | ICD-10-CM

## 2016-05-22 DIAGNOSIS — K200 Eosinophilic esophagitis: Secondary | ICD-10-CM | POA: Diagnosis not present

## 2016-05-22 DIAGNOSIS — R1310 Dysphagia, unspecified: Secondary | ICD-10-CM

## 2016-05-22 DIAGNOSIS — R1319 Other dysphagia: Secondary | ICD-10-CM

## 2016-05-22 MED ORDER — SODIUM CHLORIDE 0.9 % IV SOLN: 500.0000 mL | INTRAVENOUS | Status: DC

## 2016-05-22 MED ORDER — OMEPRAZOLE 40 MG PO CPDR: DELAYED_RELEASE_CAPSULE | ORAL | 3 refills | Status: DC

## 2016-05-22 NOTE — Patient Instructions (Signed)
YOU HAD AN ENDOSCOPIC PROCEDURE TODAY AT THE [facility]:   Refer to the procedure report that was given to you for any specific questions about what was found during the examination.  If the procedure report does not answer your questions, please call your gastroenterologist to clarify.  If you requested that your care partner not be given the details of your procedure findings, then the procedure report has been included in a sealed envelope for you to review at your convenience later.  YOU SHOULD EXPECT: Some feelings of bloating in the abdomen. Passage of more gas than usual.  Walking can help get rid of the air that was put into your GI tract during the procedure and reduce the bloating. If you had a lower endoscopy (such as a colonoscopy or flexible sigmoidoscopy) you may notice spotting of blood in your stool or on the toilet paper. If you underwent a bowel prep for your procedure, you may not have a normal bowel movement for a few days.  Please Note:  You might notice some irritation and congestion in your nose or some drainage.  This is from the oxygen used during your procedure.  There is no need for concern and it should clear up in a day or so.  SYMPTOMS TO REPORT IMMEDIATELY:    Following upper endoscopy (EGD)  Vomiting of blood or coffee ground material  New chest pain or pain under the shoulder blades  Painful or persistently difficult swallowing  New shortness of breath  Fever of 100F or higher  Black, tarry-looking stools  For urgent or emergent issues, a gastroenterologist can be reached at any hour by calling [phone].   DIET:  Follow Post Esophageal Dilation Diet.  Drink plenty of fluids but you should avoid alcoholic beverages for 24 hours.  ACTIVITY:  You should plan to take it easy for the rest of today and you should NOT DRIVE or use heavy machinery until tomorrow (because of the sedation medicines used during the test).    FOLLOW UP: Our staff  will call the number listed on your records the next business day following your procedure to check on you and address any questions or concerns that you may have regarding the information given to you following your procedure. If we do not reach you, we will leave a message.  However, if you are feeling well and you are not experiencing any problems, there is no need to return our call.  We will assume that you have returned to your regular daily activities without incident.  If any biopsies were taken you will be contacted by phone or by letter within the next 1-3 weeks.  Please call us at [phone] if you have not heard about the biopsies in 3 weeks.   Await for biopsy results Returned to clinic in 8-12 weeks for follow and to ensure improvement in dysphagia symptoms. Esophagitis and Stricture (handout given) Post Esophageal Dilation Diet (handout given)   SIGNATURES/CONFIDENTIALITY: You and/or your care partner have signed paperwork which will be entered into your electronic medical record.  These signatures attest to the fact that that the information above on your After Visit Summary has been reviewed and is understood.  Full responsibility of the confidentiality of this discharge information lies with you and/or your care-partner.

## 2016-05-22 NOTE — Progress Notes (Signed)
To recovery, report to Jones, RN, VSS 

## 2016-05-22 NOTE — Op Note (Signed)
[facility] Patient Name: Tristan LinkJeffrey Davenport Procedure Date: 05/22/2016 9:06 AM MRN: [id number] Endoscopist: Beverley FiedlerJay M Cairo Lingenfelter , MD Age: [age] Referring MD:  Date of Birth: 1980/05/24 Gender: Male Account #: [id number] Procedure:                Upper GI endoscopy Indications:              Dysphagia Medicines:                Monitored Anesthesia Care Procedure:                Pre-Anesthesia Assessment:                           - Prior to the procedure, a History and Physical                            was performed, and patient medications and                            allergies were reviewed. The patient's tolerance of                            previous anesthesia was also reviewed. The risks                            and benefits of the procedure and the sedation                            options and risks were discussed with the patient.                            All questions were answered, and informed consent                            was obtained. Prior Anticoagulants: The patient has                            taken no previous anticoagulant or antiplatelet                            agents. ASA Grade Assessment: II - A patient with                            mild systemic disease. After reviewing the risks                            and benefits, the patient was deemed in                            satisfactory condition to undergo the procedure.                           After obtaining informed consent, the endoscope was  passed under direct vision. Throughout the                            procedure, the patient's blood pressure, pulse, and                            oxygen saturations were monitored continuously. The                            Model GIF-HQ190 [id number]) scope was introduced                            through the mouth, and advanced to the second part                            of duodenum. The upper GI endoscopy was                         accomplished without difficulty. The patient                            tolerated the procedure well. Scope In: Scope Out: Findings:                 Mucosal changes including longitudinal furrows and                            congestion (edema) were found in the middle third                            of the esophagus and in the lower third of the                            esophagus. Biopsies were obtained from the proximal                            and distal esophagus with cold forceps for                            histology of suspected eosinophilic esophagitis.                           LA Grade B (one or more mucosal breaks greater than                            5 mm, not extending between the tops of two mucosal                            folds) esophagitis with no bleeding was found 38 to                            40 cm from the incisors. A guidewire was placed and  the scope was withdrawn. Dilation was performed                            with a Savary dilator with mild resistance at 17 mm.                           The cardia and gastric fundus were normal on                            retroflexion.                           Normal mucosa was found in the entire examined                            stomach.                           The examined duodenum was normal. Complications:            No immediate complications. Estimated Blood Loss:     Estimated blood loss was minimal. Impression:               - Esophageal mucosal changes suggestive of                            eosinophilic esophagitis. Biopsied.                           - LA Grade B reflux esophagitis. Dilated.                           - Normal mucosa was found in the entire stomach.                           - Normal examined duodenum. Recommendation:           - Patient has a contact number available for                            emergencies. The signs and  symptoms of potential                            delayed complications were discussed with the                            patient. Return to normal activities tomorrow.                            Written discharge instructions were provided to the                            patient.                           - Resume previous diet.                           -  Continue present medications. Would increase                            omeprazole to 40 mg twice daily before meals x 2                            weeks, then once daily.                           - Await pathology results.                           - Return to clinic in 8-12 weeks for follow-up and                            to ensure improvement in dysphagia symptoms. Beverley Fiedler, MD 05/22/2016 9:36:18 AM This report has been signed electronically.

## 2016-05-22 NOTE — Progress Notes (Signed)
Called to room to assist during endoscopic procedure.  Patient ID and intended procedure confirmed with present staff. Received instructions for my participation in the procedure from the performing physician.  

## 2016-05-23 ENCOUNTER — Ambulatory Visit: Payer: Commercial Managed Care - HMO | Admitting: Physician Assistant

## 2016-05-23 ENCOUNTER — Telehealth: Payer: Self-pay

## 2016-05-23 NOTE — Telephone Encounter (Signed)
Name identifier, left voicemail will call back later today.

## 2016-05-26 ENCOUNTER — Telehealth: Payer: Self-pay | Admitting: Physician Assistant

## 2016-05-26 ENCOUNTER — Telehealth: Payer: Self-pay | Admitting: *Deleted

## 2016-05-26 NOTE — Telephone Encounter (Signed)
Error

## 2016-05-26 NOTE — Telephone Encounter (Signed)
  Follow up Call-  Call back number 05/22/2016  Post procedure Call Back phone  # [phone]  Permission to leave phone message Yes     Patient questions:  Do you have a fever, pain , or abdominal swelling? No. Pain Score  0 *  Have you tolerated food without any problems? Yes.    Have you been able to return to your normal activities? Yes.    Do you have any questions about your discharge instructions: Diet   No. Medications  No. Follow up visit  No.  Do you have questions or concerns about your Care? No.  Actions: * If pain score is 4 or above: No action needed, pain <4.

## 2016-05-27 ENCOUNTER — Ambulatory Visit (INDEPENDENT_AMBULATORY_CARE_PROVIDER_SITE_OTHER): Payer: Commercial Managed Care - HMO | Admitting: Family Medicine

## 2016-05-27 ENCOUNTER — Encounter: Payer: Self-pay | Admitting: Family Medicine

## 2016-05-27 VITALS — BP 150/84 | HR 81 | Temp 98.3°F | Ht 72.0 in | Wt 199.0 lb

## 2016-05-27 DIAGNOSIS — I10 Essential (primary) hypertension: Secondary | ICD-10-CM | POA: Diagnosis not present

## 2016-05-27 MED ORDER — LISINOPRIL 10 MG PO TABS: 10.0000 mg | ORAL_TABLET | Freq: Every day | ORAL | 1 refills | Status: DC

## 2016-05-27 NOTE — Progress Notes (Signed)
Pre visit review using our clinic review tool, if applicable. No additional management support is needed unless otherwise documented below in the visit note. 

## 2016-05-27 NOTE — Progress Notes (Signed)
   Subjective   Tristan Davenport is a 36 y.o. male here to follow up on hypertension.   Wt Readings from Last 3 Encounters:  05/27/16 199 lb (90.3 kg)  05/22/16 202 lb (91.6 kg)  05/09/16 202 lb (91.6 kg)   Reports that he quit smoking cigarettes about 9 years ago.   BP Readings from Last 3 Encounters:  05/27/16 150/84, 144/82  05/22/16 140/78   Lab Results  Component Value Date   CREATININE 1.10 05/06/2016   Review of Systems: No headache, visual changes, nausea, vomiting, diarrhea, constipation, dizziness, abdominal pain, skin rash, fevers, chills, night sweats, weight loss, swollen lymph nodes, body aches, joint swelling, muscle aches, chest pain, shortness of breath, mood changes.   PFMSH, Medications, Allergies reviewed and updated where appropriate.  Objective:     BP (!) 150/84 (BP Location: Left Arm, Cuff Size: Normal)   Pulse 81   Temp 98.3 F (36.8 C) (Oral)   Ht 6' (1.829 m)   Wt 199 lb (90.3 kg)   SpO2 94%   BMI 26.99 kg/m   General appearance: alert, appears stated age and cooperative Lungs: clear to auscultation bilaterally Heart: regular rate and rhythm, S1, S2 normal, no murmur, click, rub or gallop Skin: Skin color, texture, turgor normal. No rashes or lesions Neurologic: Grossly normal  Assessment and plan:   Tristan Davenport was seen today for follow-up.  Diagnoses and all orders for this visit:  Essential hypertension Comments: Patient okay with starting medication today. Orders: -     lisinopril (PRINIVIL,ZESTRIL) 10 MG tablet; Take 1 tablet (10 mg total) by mouth daily.  Medications prescribed today Requested Prescriptions   Signed Prescriptions Disp Refills  . lisinopril (PRINIVIL,ZESTRIL) 10 MG tablet 90 tablet 1    Sig: Take 1 tablet (10 mg total) by mouth daily.    Helane RimaErica Rodolphe Edmonston, D.O. [facility], [facility]

## 2016-05-28 ENCOUNTER — Encounter: Payer: Self-pay | Admitting: Internal Medicine

## 2016-05-28 DIAGNOSIS — K200 Eosinophilic esophagitis: Secondary | ICD-10-CM | POA: Insufficient documentation

## 2016-06-02 ENCOUNTER — Ambulatory Visit
Admission: RE | Admit: 2016-06-02 | Discharge: 2016-06-02 | Disposition: A | Discharge status: Home / Self Care | Payer: Commercial Managed Care - HMO | Source: Ambulatory Visit | Attending: Physician Assistant | Admitting: Physician Assistant

## 2016-06-02 ENCOUNTER — Encounter: Payer: Self-pay | Admitting: Physician Assistant

## 2016-06-02 DIAGNOSIS — N50819 Testicular pain, unspecified: Secondary | ICD-10-CM

## 2016-06-02 DIAGNOSIS — I10 Essential (primary) hypertension: Secondary | ICD-10-CM | POA: Insufficient documentation

## 2016-06-23 ENCOUNTER — Ambulatory Visit (INDEPENDENT_AMBULATORY_CARE_PROVIDER_SITE_OTHER): Payer: Commercial Managed Care - HMO | Admitting: Physician Assistant

## 2016-06-23 ENCOUNTER — Other Ambulatory Visit (HOSPITAL_COMMUNITY)
Admission: RE | Admit: 2016-06-23 | Discharge: 2016-06-23 | Disposition: A | Discharge status: Home / Self Care | Payer: Commercial Managed Care - HMO | Source: Ambulatory Visit | Attending: Physician Assistant | Admitting: Physician Assistant

## 2016-06-23 ENCOUNTER — Encounter: Payer: Self-pay | Admitting: Physician Assistant

## 2016-06-23 VITALS — BP 132/86 | HR 88 | Temp 98.4°F | Ht 72.0 in | Wt 199.0 lb

## 2016-06-23 DIAGNOSIS — R300 Dysuria: Secondary | ICD-10-CM | POA: Diagnosis not present

## 2016-06-23 DIAGNOSIS — I10 Essential (primary) hypertension: Secondary | ICD-10-CM

## 2016-06-23 LAB — URINALYSIS, ROUTINE W REFLEX MICROSCOPIC
BILIRUBIN URINE: NEGATIVE
Hgb urine dipstick: NEGATIVE
Ketones, ur: NEGATIVE
Leukocytes, UA: NEGATIVE
Nitrite: NEGATIVE
PH: 7.5 (ref 5.0–8.0)
RBC / HPF: NONE SEEN (ref 0–?)
Specific Gravity, Urine: 1.01 (ref 1.000–1.030)
TOTAL PROTEIN, URINE-UPE24: NEGATIVE
UROBILINOGEN UA: 0.2 (ref 0.0–1.0)
Urine Glucose: NEGATIVE
WBC, UA: NONE SEEN (ref 0–?)

## 2016-06-23 LAB — BASIC METABOLIC PANEL
BUN: 27 mg/dL — ABNORMAL HIGH (ref 6–23)
CO2: 28 mEq/L (ref 19–32)
Calcium: 9.4 mg/dL (ref 8.4–10.5)
Chloride: 100 mEq/L (ref 96–112)
Creatinine, Ser: 1.25 mg/dL (ref 0.40–1.50)
GFR: 68.8 mL/min (ref >60.00)
Glucose, Bld: 73 mg/dL (ref 70–99)
Potassium: 4.6 mEq/L (ref 3.5–5.1)
Sodium: 134 mEq/L — ABNORMAL LOW (ref 135–145)

## 2016-06-23 NOTE — Progress Notes (Signed)
Tristan Davenport is a 36 y.o. male is here to discuss:  I acted as a Neurosurgeonscribe for Energy East CorporationSamantha Worley, PA-C Corky Mullonna Maryland Stell, LPN  History of Present Illness:   Chief Complaint  Patient presents with  . Follow-up  . Hypertension    HPI   HTN - patient reports that he is taking his blood pressure medicine, Lisinopril 10mg  daily without any significant side effects. He denies chest pain, SOB, lower leg swelling, HA, blurred vision. He does admit that he does not check his blood pressure regularly.  Burning with urination - over the past two days he reports moderate burning with urination that is mostly resolved; denies back pain, fevers, discharge or lesions, he denies any concerns for STI's however he is agreeable to testing today. He feels as though he has been well hydrated recently.   There are no preventive care reminders to display for this patient.  PMHx, SurgHx, SocialHx, FamHx, Medications, and Allergies were reviewed in the Visit Navigator and updated as appropriate.   Patient Active Problem List   Diagnosis Date Noted  . HTN (hypertension) 06/02/2016  . Testicular discomfort 06/02/2016  . Eosinophilic esophagitis 05/28/2016  . Onychomycosis 03/19/2015  . Tinea pedis of both feet 02/18/2015    Social History  Substance Use Topics  . Smoking status: Former Smoker    Types: Cigarettes    Quit date: 05/07/2007  . Smokeless tobacco: Former NeurosurgeonUser    Types: Snuff    Quit date: 05/06/2013  . Alcohol use 7.2 oz/week    12 Shots of liquor per week    Current Medications and Allergies:    Current Outpatient Prescriptions:  .  arginine 500 MG tablet, Take 500 mg by mouth daily., Disp: , Rfl:  .  CITRULLINE PO, Take 3 capsules by mouth daily. 250 mg each, Disp: , Rfl:  .  lisinopril (PRINIVIL,ZESTRIL) 10 MG tablet, Take 1 tablet (10 mg total) by mouth daily., Disp: 90 tablet, Rfl: 1 .  NON FORMULARY, , Disp: , Rfl:  .  omeprazole (PRILOSEC) 40 MG capsule, Increase Omeprazole  40mg  twice a day for two weeks before meals, then once daily., Disp: 90 capsule, Rfl: 3  Current Facility-Administered Medications:  .  0.9 %  sodium chloride infusion, 500 mL, Intravenous, Continuous, Beverley FiedlerJay M Pyrtle, MD   Allergies  Allergen Reactions  . Penicillins     Review of Systems   Review of Systems  Constitutional: Negative for chills and fever.  Cardiovascular: Negative for chest pain, palpitations, claudication and leg swelling.  Genitourinary: Positive for dysuria. Negative for frequency, hematuria and urgency.  Musculoskeletal: Negative for back pain.  Skin: Negative for rash.    Vitals:   Vitals:   06/23/16 1008  BP: 132/86  Pulse: 88  Temp: 98.4 F (36.9 C)  TempSrc: Oral  SpO2: 97%  Weight: 199 lb (90.3 kg)  Height: 6' (1.829 m)     Body mass index is 26.99 kg/m.   Physical Exam:    Physical Exam  Constitutional: He appears well-developed. He is cooperative.  Non-toxic appearance. He does not have a sickly appearance. He does not appear ill. No distress.  Cardiovascular: Normal rate, regular rhythm, S1 normal, S2 normal, normal heart sounds and normal pulses.   No LE edema  Pulmonary/Chest: Effort normal and breath sounds normal.  Neurological: He is alert.  Nursing note and vitals reviewed.     Assessment and Plan:    Tristan Davenport was seen today for follow-up and hypertension.  Diagnoses  and all orders for this visit:  Problem List Items Addressed This Visit      Cardiovascular and Mediastinum   HTN (hypertension)    Doing well with Lisinopril 10mg . Will check BMP today. Follow-up in 3 months.      Relevant Orders   Basic metabolic panel    Other Visit Diagnoses    Burning with urination    -  Primary Patient declines prophylactic antibiotic today. He would like to wait for your results. He did provided consent to STI testing of his urine. I discussed follow-up with any change or lack of improvement in symptoms.    Relevant Orders    Urinalysis, Routine w reflex microscopic   Basic metabolic panel   Urine cytology ancillary only     . Reviewed expectations re: course of current medical issues. . Discussed self-management of symptoms. . Outlined signs and symptoms indicating need for more acute intervention. . Patient verbalized understanding and all questions were answered. . See orders for this visit as documented in the electronic medical record. . Patient received an After Visit Summary.    Jarold Motto, PA-C , [place] [facility] 06/23/2016  Follow-up: Return in about 3 months (around 09/23/2016) for blood pressure f/u.

## 2016-06-23 NOTE — Progress Notes (Signed)
Pre visit review using our clinic review tool, if applicable. No additional management support is needed unless otherwise documented below in the visit note. 

## 2016-06-23 NOTE — Patient Instructions (Signed)
We will call you with your lab results.  If you develop any fevers, back pain or discharge -- please let us know.

## 2016-06-23 NOTE — Assessment & Plan Note (Signed)
Doing well with Lisinopril 10mg . Will check BMP today. Follow-up in 3 months.

## 2016-06-24 LAB — URINE CYTOLOGY ANCILLARY ONLY
Chlamydia: NEGATIVE
Neisseria Gonorrhea: NEGATIVE

## 2016-07-22 ENCOUNTER — Ambulatory Visit (INDEPENDENT_AMBULATORY_CARE_PROVIDER_SITE_OTHER): Payer: Commercial Managed Care - HMO | Admitting: Internal Medicine

## 2016-07-22 ENCOUNTER — Encounter: Payer: Self-pay | Admitting: Internal Medicine

## 2016-07-22 VITALS — BP 132/90 | HR 84 | Ht 71.16 in | Wt 193.4 lb

## 2016-07-22 DIAGNOSIS — K200 Eosinophilic esophagitis: Secondary | ICD-10-CM | POA: Diagnosis not present

## 2016-07-22 NOTE — Patient Instructions (Signed)
Follow up with Dr Rhea Belton as needed.  If you are age 38 or older, your body mass index should be between 23-30. Your Body mass index is 26.85 kg/m. If this is out of the aforementioned range listed, please consider follow up with your Primary Care Provider.  If you are age 26 or younger, your body mass index should be between 19-25. Your Body mass index is 26.85 kg/m. If this is out of the aformentioned range listed, please consider follow up with your Primary Care Provider.

## 2016-07-22 NOTE — Progress Notes (Signed)
Subjective:    Patient ID: Tristan Davenport, male    DOB: 09-26-1980, 36 y.o.   MRN: [id number]  HPI Tristan Davenport is a 36 yo male with recent dx of EoE who is here for follow-up.  He is here alone today. He was seen initially by Willette Cluster, NP 1 05/08/2016. At this time he was having dysphagia and tightness with swallowing. Upper endoscopy was arranged performed on 05/22/2016. Upper endoscopy revealed mucosal changes suggestive of EoE which was biopsied. There was LA grade B reflux esophagitis and the esophagus was dilated with an 18 French Savary dilator. He was continued on omeprazole but the dose was increased to 40 mg twice a day. Pathology results consistent with EoE.  He returns today stating that he is 100% better. He used omeprazole 40 mg twice a day for 2 weeks and his symptoms resolved completely. He is having some occasional discomfort in the epigastrium which she states when necessary omeprazole seems to help. He's using this 2-3 days per week. This is usually not in succession. He does not want to take this every day. He has changed his diet somewhat and is avoiding soy and legumes. He feels that this is helped. He does not have a history of asthma allergy or eczema. He currently denies heartburn, dysphagia, odynophagia, nausea, vomiting. Appetite is good. Bowel moments are regular without blood or melena.  Review of Systems As per history of present illness, otherwise negative  Current Medications, Allergies, Past Medical History, Past Surgical History, Family History and Social History were reviewed in [employer] record.     Objective:   Physical Exam BP 132/90 (BP Location: Left Arm, Patient Position: Sitting, Cuff Size: Normal)   Pulse 84   Ht 5' 11.16" (1.807 m) Comment: height measured without shoes  Wt 193 lb 6 oz (87.7 kg)   BMI 26.85 kg/m  Constitutional: Well-developed and well-nourished. No distress. HEENT: Normocephalic and atraumatic.    No scleral icterus. Psychiatric: Normal mood and affect. Behavior is normal.  CBC    Component Value Date/Time   WBC 5.6 05/06/2016 1131   RBC 5.12 05/06/2016 1131   HGB 16.3 05/06/2016 1131   HCT 46.9 05/06/2016 1131   PLT 234.0 05/06/2016 1131   MCV 91.5 05/06/2016 1131   MCH 31.2 03/16/2015 1305   MCHC 34.9 05/06/2016 1131   RDW 13.3 05/06/2016 1131   LYMPHSABS 1.3 05/06/2016 1131   MONOABS 0.5 05/06/2016 1131   EOSABS 0.2 05/06/2016 1131   BASOSABS 0.0 05/06/2016 1131       Assessment & Plan:  36 yo male with recent dx of EoE who is here for follow-up.  1. EoE -- He has had a complete clinical response to PPI alone without the need for topical steroid therapy. He has no further symptoms of reflux or esophagitis despite now having stopped omeprazole. He is using it 2-3 days a week for dyspeptic type symptoms. We discussed how this medication is best used daily rather than as needed. He has not interested in daily use. He would like to continue diet modification and continue omeprazole on an as-needed basis. I am okay with this for now but have explained that EoE can wax and wane and may flare again in the future. Should this occur he would need daily and consistent PPI use. Repeat endoscopy also would be considered for recurrent dysphagia or symptoms uncontrolled by PPI. He will follow-up as needed at his request.  15 minutes spent with the  patient today. Greater than 50% was spent in counseling and coordination of care with the patient

## 2016-08-24 DIAGNOSIS — R030 Elevated blood-pressure reading, without diagnosis of hypertension: Secondary | ICD-10-CM | POA: Diagnosis not present

## 2017-02-21 DIAGNOSIS — M545 Low back pain: Secondary | ICD-10-CM | POA: Diagnosis not present

## 2017-02-24 DIAGNOSIS — M545 Low back pain: Secondary | ICD-10-CM | POA: Diagnosis not present

## 2017-03-02 DIAGNOSIS — M545 Low back pain: Secondary | ICD-10-CM | POA: Diagnosis not present

## 2017-03-16 DIAGNOSIS — M545 Low back pain: Secondary | ICD-10-CM | POA: Diagnosis not present

## 2017-03-17 DIAGNOSIS — M545 Low back pain: Secondary | ICD-10-CM | POA: Diagnosis not present

## 2017-03-20 DIAGNOSIS — M545 Low back pain: Secondary | ICD-10-CM | POA: Diagnosis not present

## 2017-03-26 DIAGNOSIS — M545 Low back pain: Secondary | ICD-10-CM | POA: Diagnosis not present

## 2017-04-06 DIAGNOSIS — M545 Low back pain: Secondary | ICD-10-CM | POA: Diagnosis not present

## 2017-04-13 DIAGNOSIS — M545 Low back pain: Secondary | ICD-10-CM | POA: Diagnosis not present

## 2017-04-27 DIAGNOSIS — M545 Low back pain: Secondary | ICD-10-CM | POA: Diagnosis not present

## 2017-05-05 ENCOUNTER — Telehealth: Payer: Self-pay | Admitting: Physician Assistant

## 2017-05-05 DIAGNOSIS — E785 Hyperlipidemia, unspecified: Secondary | ICD-10-CM

## 2017-05-05 NOTE — Telephone Encounter (Signed)
Please advise.   Copied from CRM [phone]. Topic: Inquiry >> May 04, 2017  5:11 PM Everardo Pacific[place], [place], [place] wrote: Reason for CRM: Patient needs orders for fasting labs for CPE. Cpe is 05-18-2017 @ 9:30am

## 2017-05-07 LAB — PULMONARY FUNCTION TEST

## 2017-05-11 ENCOUNTER — Other Ambulatory Visit: Payer: Self-pay | Admitting: Physician Assistant

## 2017-05-11 DIAGNOSIS — E785 Hyperlipidemia, unspecified: Secondary | ICD-10-CM | POA: Insufficient documentation

## 2017-05-11 DIAGNOSIS — I10 Essential (primary) hypertension: Secondary | ICD-10-CM

## 2017-05-11 DIAGNOSIS — Z79899 Other long term (current) drug therapy: Secondary | ICD-10-CM

## 2017-05-11 NOTE — Telephone Encounter (Signed)
Future labs have been placed.  Jarold MottoSamantha Elita Dame PA-C 05/11/17

## 2017-05-11 NOTE — Telephone Encounter (Signed)
Please advise 

## 2017-05-11 NOTE — Telephone Encounter (Signed)
Left voicemail requesting call back to schedule lab appointment.

## 2017-05-13 NOTE — Telephone Encounter (Signed)
Left detailed message on personal voicemail that lab orders were placed and just needs to call back to schedule lab appt. If you do not come prior to appt will just do that day please come fasting nothing to eat after midnight may have water, black coffee and unsweet tea. Please call office to schedule.

## 2017-05-18 ENCOUNTER — Ambulatory Visit (INDEPENDENT_AMBULATORY_CARE_PROVIDER_SITE_OTHER): Payer: 59 | Admitting: Physician Assistant

## 2017-05-18 ENCOUNTER — Encounter: Payer: Self-pay | Admitting: Physician Assistant

## 2017-05-18 ENCOUNTER — Ambulatory Visit (INDEPENDENT_AMBULATORY_CARE_PROVIDER_SITE_OTHER): Payer: 59

## 2017-05-18 VITALS — BP 132/90 | HR 90 | Temp 98.4°F | Ht 71.0 in | Wt 194.4 lb

## 2017-05-18 DIAGNOSIS — K200 Eosinophilic esophagitis: Secondary | ICD-10-CM | POA: Diagnosis not present

## 2017-05-18 DIAGNOSIS — M545 Low back pain: Secondary | ICD-10-CM | POA: Diagnosis not present

## 2017-05-18 DIAGNOSIS — Z0001 Encounter for general adult medical examination with abnormal findings: Secondary | ICD-10-CM | POA: Diagnosis not present

## 2017-05-18 DIAGNOSIS — E785 Hyperlipidemia, unspecified: Secondary | ICD-10-CM

## 2017-05-18 DIAGNOSIS — G8929 Other chronic pain: Secondary | ICD-10-CM | POA: Diagnosis not present

## 2017-05-18 DIAGNOSIS — I10 Essential (primary) hypertension: Secondary | ICD-10-CM

## 2017-05-18 DIAGNOSIS — R1011 Right upper quadrant pain: Secondary | ICD-10-CM

## 2017-05-18 DIAGNOSIS — M48061 Spinal stenosis, lumbar region without neurogenic claudication: Secondary | ICD-10-CM | POA: Diagnosis not present

## 2017-05-18 MED ORDER — LISINOPRIL 10 MG PO TABS: 10.0000 mg | ORAL_TABLET | Freq: Every day | ORAL | 1 refills | Status: DC

## 2017-05-18 NOTE — Patient Instructions (Addendum)
It was great to see you!  We will call you with your xray result. You will be contacted for your abdominal ultrasound.  If symptoms worsen, please seek medical attention.  Let's follow-up in 6 months for your blood pressure.    Health Maintenance, Male A healthy lifestyle and preventive care is important for your health and wellness. Ask your health care provider about what schedule of regular examinations is right for you. What should I know about weight and diet? Eat a Healthy Diet  Eat plenty of vegetables, fruits, whole grains, low-fat dairy products, and lean protein.  Do not eat a lot of foods high in solid fats, added sugars, or salt.  Maintain a Healthy Weight Regular exercise can help you achieve or maintain a healthy weight. You should:  Do at least 150 minutes of exercise each week. The exercise should increase your heart rate and make you sweat (moderate-intensity exercise).  Do strength-training exercises at least twice a week.  Watch Your Levels of Cholesterol and Blood Lipids  Have your blood tested for lipids and cholesterol every 5 years starting at 39 years of age. If you are at high risk for heart disease, you should start having your blood tested when you are 39 years old. You may need to have your cholesterol levels checked more often if: ? Your lipid or cholesterol levels are high. ? You are older than 39 years of age. ? You are at high risk for heart disease.  What should I know about cancer screening? Many types of cancers can be detected early and may often be prevented. Lung Cancer  You should be screened every year for lung cancer if: ? You are a current smoker who has smoked for at least 30 years. ? You are a former smoker who has quit within the past 15 years.  Talk to your health care provider about your screening options, when you should start screening, and how often you should be screened.  Colorectal Cancer  Routine colorectal cancer  screening usually begins at 39 years of age and should be repeated every 5-10 years until you are 39 years old. You may need to be screened more often if early forms of precancerous polyps or small growths are found. Your health care provider may recommend screening at an earlier age if you have risk factors for colon cancer.  Your health care provider may recommend using home test kits to check for hidden blood in the stool.  A small camera at the end of a tube can be used to examine your colon (sigmoidoscopy or colonoscopy). This checks for the earliest forms of colorectal cancer.  Prostate and Testicular Cancer  Depending on your age and overall health, your health care provider may do certain tests to screen for prostate and testicular cancer.  Talk to your health care provider about any symptoms or concerns you have about testicular or prostate cancer.  Skin Cancer  Check your skin from head to toe regularly.  Tell your health care provider about any new moles or changes in moles, especially if: ? There is a change in a mole's size, shape, or color. ? You have a mole that is larger than a pencil eraser.  Always use sunscreen. Apply sunscreen liberally and repeat throughout the day.  Protect yourself by wearing long sleeves, pants, a wide-brimmed hat, and sunglasses when outside.  What should I know about heart disease, diabetes, and high blood pressure?  If you are 18-39 years of  age, have your blood pressure checked every 3-5 years. If you are 40 years of age or older, have your blood pressure checked every year. You should have your blood pressure measured twice-once when you are at a hospital or clinic, and once when you are not at a hospital or clinic. Record the average of the two measurements. To check your blood pressure when you are not at a hospital or clinic, you can use: ? An automated blood pressure machine at a pharmacy. ? A home blood pressure monitor.  Talk to your  health care provider about your target blood pressure.  If you are between 45-79 years old, ask your health care provider if you should take aspirin to prevent heart disease.  Have regular diabetes screenings by checking your fasting blood sugar level. ? If you are at a normal weight and have a low risk for diabetes, have this test once every three years after the age of 45. ? If you are overweight and have a high risk for diabetes, consider being tested at a younger age or more often.  A one-time screening for abdominal aortic aneurysm (AAA) by ultrasound is recommended for men aged 65-75 years who are current or former smokers. What should I know about preventing infection? Hepatitis B If you have a higher risk for hepatitis B, you should be screened for this virus. Talk with your health care provider to find out if you are at risk for hepatitis B infection. Hepatitis C Blood testing is recommended for:  Everyone born from 1945 through 1965.  Anyone with known risk factors for hepatitis C.  Sexually Transmitted Diseases (STDs)  You should be screened each year for STDs including gonorrhea and chlamydia if: ? You are sexually active and are younger than 39 years of age. ? You are older than 39 years of age and your health care provider tells you that you are at risk for this type of infection. ? Your sexual activity has changed since you were last screened and you are at an increased risk for chlamydia or gonorrhea. Ask your health care provider if you are at risk.  Talk with your health care provider about whether you are at high risk of being infected with HIV. Your health care provider may recommend a prescription medicine to help prevent HIV infection.  What else can I do?  Schedule regular health, dental, and eye exams.  Stay current with your vaccines (immunizations).  Do not use any tobacco products, such as cigarettes, chewing tobacco, and e-cigarettes. If you need help  quitting, ask your health care provider.  Limit alcohol intake to no more than 2 drinks per day. One drink equals 12 ounces of beer, 5 ounces of wine, or 1 ounces of hard liquor.  Do not use street drugs.  Do not share needles.  Ask your health care provider for help if you need support or information about quitting drugs.  Tell your health care provider if you often feel depressed.  Tell your health care provider if you have ever been abused or do not feel safe at home. This information is not intended to replace advice given to you by your health care provider. Make sure you discuss any questions you have with your health care provider. Document Released: 09/13/2007 Document Revised: 11/14/2015 Document Reviewed: 12/19/2014 Elsevier Interactive Patient Education  2018 [employer]  

## 2017-05-18 NOTE — Progress Notes (Signed)
I acted as a Neurosurgeon for [employer], PA-C [employer], LPN.  Subjective:    Tristan Davenport is a 37 y.o. male and is here for a comprehensive physical exam.  Patient had lab work performed prior to today's visit. And I have this to review. I will attach to media. Lipid panel within normal limits. CMP and CBC within normal limits.   HPI  There are no preventive care reminders to display for this patient.  Acute Concerns: Abdominal pain -- right quadrant;  "daily throbbing", worsened with carbohydrate intake, pain at its worst is 5/10. Has been going on since at least last fall. Waxes and wanes. Did have significant alcohol intake last week and this did not make this worse. It is exacerbated with abdominal exercises. No issues with BMs. Denies blood in stool, constipation, diarrhea. Overall it has not improved since it presented.  Chronic Issues: Eosinophilic esophagitis -- did have workup with Dr. Rhea Belton last spring and was diagnosed with this. He no longer takes omeprazole, unless he anticipates eating something that we will causing exacerbation of his symptoms. He very rarely has any symptoms at this point. He denies any further choking spells, unintentional weight loss, emesis. Lower back pain -- he has been seeing chiropractor and physical therapist since last fall. His chiropractor does not do any imaging. He continues to do his workouts, however he does have intermittent lower back pain. HTN -- Currently taking lisinopril 10 mg. At home blood pressure readings are:  123-135/75-90. Patient denies chest pain, SOB, blurred vision, dizziness, unusual headaches, lower leg swelling. Patient is compliant with medication. Denies excessive caffeine intake, stimulant usage, excessive alcohol intake, or increase in salt consumption.    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, often gets little sleep Exercise -- very active and does a lot of weight training Weight -- Weight: 194 lb 6.1 oz (88.2 kg)  Mood -- fair  Depression screen [facility] 2/9 05/18/2017  Decreased Interest 0  Down, Depressed, Hopeless 0  PHQ - 2 Score 0  Altered sleeping -  Tired, decreased energy -  Change in appetite -  Feeling bad or failure about yourself  -  Trouble concentrating -  Moving slowly or fidgety/restless -  Suicidal thoughts -  PHQ-9 Score -  Difficult doing work/chores -    Other providers/specialists: [facility], started in December PT -- sees for his back since last fall Chiropractor -- sees for his back since last fall  PMHx, SurgHx, SocialHx, Medications, and Allergies were reviewed in the Visit Navigator and updated as appropriate.   Past Medical History:  Diagnosis Date  . Cluster headaches    last one was 5 year  . Eosinophilic esophagitis 2018   biopsies positive on 05/22/16; followed by GI    History reviewed. No pertinent surgical history.   Family History  Problem Relation Age of Onset  . Stroke Mother 34  . Hypertension Father   . Other Father        Giant Cell Arteririts and Polymyalgia Rheumatica  . Anorexia nervosa Sister   . Parkinson's disease Maternal Grandfather   . Emphysema Paternal Grandfather   . Colon cancer Neg Hx   . Stomach cancer Neg  Hx     Social History   Tobacco Use  . Smoking status: Former Smoker    Types: Cigarettes    Last attempt to quit: 05/07/2007    Years since quitting: 10.0  . Smokeless tobacco: Former NeurosurgeonUser    Types: Snuff    Quit date: 05/06/2013  Substance Use Topics  . Alcohol use: Yes    Alcohol/week: 7.2 oz    Types: 12 Shots of liquor per week  . Drug use: No    Review of Systems:   Review of Systems  Constitutional: Negative for chills,  fever, malaise/fatigue and weight loss.  HENT: Negative for hearing loss, sinus pain and sore throat.   Eyes: Negative for blurred vision.  Respiratory: Negative for cough and shortness of breath.   Cardiovascular: Negative for chest pain, palpitations and leg swelling.  Gastrointestinal: Positive for abdominal pain. Negative for constipation, diarrhea, heartburn, nausea and vomiting.  Genitourinary: Negative for dysuria, frequency and urgency.  Musculoskeletal: Positive for back pain. Negative for myalgias and neck pain.  Skin: Negative for itching and rash.  Neurological: Negative for dizziness, tingling, seizures, loss of consciousness and headaches.  Endo/Heme/Allergies: Negative for polydipsia.  Psychiatric/Behavioral: Negative for depression. The patient is not nervous/anxious.     Objective:   Vitals:   05/18/17 0939  BP: 132/90  Pulse: 90  Temp: 98.4 F (36.9 C)  SpO2: 97%   Body mass index is 27.11 kg/m.  General Appearance:  Alert, cooperative, no distress, appears stated age  Head:  Normocephalic, without obvious abnormality, atraumatic  Eyes:  PERRL, conjunctiva/corneas clear, EOM's intact, fundi benign, both eyes       Ears:  Normal TM's and external ear canals, both ears  Nose: Nares normal, septum midline, mucosa normal, no drainage    or sinus tenderness  Throat: Lips, mucosa, and tongue normal; teeth and gums normal  Neck: Supple, symmetrical, trachea midline, no adenopathy; thyroid:  No enlargement/tenderness/nodules; no carotit bruit or JVD  Back:   Symmetric, no curvature, ROM normal, no CVA tenderness  Lungs:   Clear to auscultation bilaterally, respirations unlabored  Chest wall:  No tenderness or deformity  Heart:  Regular rate and rhythm, S1 and S2 normal, no murmur, rub   or gallop  Abdomen:   Soft, non-tender, bowel sounds active all four quadrants, no masses, no organomegaly  Extremities: Extremities normal, atraumatic, no cyanosis or edema   Prostate: Not done.   Skin: Skin color, texture, turgor normal, no rashes or lesions  Lymph nodes: Cervical, supraclavicular, and axillary nodes normal  Neurologic: CNII-XII grossly intact. Normal strength, sensation and reflexes throughout   CLINICAL DATA:  Lumbago for 6 months  EXAM: LUMBAR SPINE - 2-3 VIEW  COMPARISON:  None.  FINDINGS: Frontal, lateral, and spot lumbosacral lateral images were obtained. There are 5 non-rib-bearing lumbar type vertebral bodies. There is no fracture or spondylolisthesis. There is slight disc space narrowing at L1-2. Other disc spaces appear unremarkable. No erosive change. There are small anterior osteophytes at L1, L2, and L3.  IMPRESSION: Mild disc space narrowing at L1-2. No fracture or spondylolisthesis.   Electronically Signed   By: Bretta BangWilliam  Woodruff III M.D.   On: 05/18/2017 10:49  Assessment/Plan:   Tinnie GensJeffrey was seen today for annual exam.  Diagnoses and all orders for this visit:  Encounter for general adult medical examination with abnormal findings Today patient counseled on age appropriate routine health concerns for screening and prevention, each reviewed and up to date or declined. Immunizations reviewed  and up to date or declined. Labs ordered and reviewed. Risk factors for depression reviewed and negative. Hearing function and visual acuity are intact. ADLs screened and addressed as needed. Functional ability and level of safety reviewed and appropriate. Education, counseling and referrals performed based on assessed risks today. Patient provided with a copy of personalized plan for preventive services.  Chronic right-sided low back pain, with sciatica presence unspecified No red flags on exam. Lumbar xray today shows: mild disc space narrowing at L1-2. No fracture or spondylolisthesis. Discussed briefly with Dr. Gaspar Bidding. Will recommend Goodman exercises. Continue with PT and follow-up with Dr. Gaspar Bidding prn. -      DG Lumbar Spine 2-3 Views  Essential hypertension Currently well controlled. Refilled Lisinopril 10 mg today. Follow-up in 6 months.  Eosinophilic esophagitis Per GI discretion. He does not like to take Prilosec daily. Continue prn and if consistent symptoms, begin taking daily.  Hyperlipidemia, unspecified hyperlipidemia type Most recent lipid panel WNL. Continue healthy diet.  Right upper quadrant abdominal pain No red flags on exam. I recommended that we get an abdominal u/s. If symptoms worsen or persist I told him to let me know, or seek medical attention. Most recent liver enzymes within normal limits.    Well Adult Exam: Labs ordered: No. Patient counseling was done. See below for items discussed. Discussed the patient's BMI.  The BMI BMI is in the acceptable range Follow up in 6 months.  Patient Counseling: [x]   Nutrition: Stressed importance of moderation in sodium/caffeine intake, saturated fat and cholesterol, caloric balance, sufficient intake of fresh fruits, vegetables, and fiber.  [x]   Stressed the importance of regular exercise.   []   Substance Abuse: Discussed cessation/primary prevention of tobacco, alcohol, or other drug use; driving or other dangerous activities under the influence; availability of treatment for abuse.   [x]   Injury prevention: Discussed safety belts, safety helmets, smoke detector, smoking near bedding or upholstery.   []   Sexuality: Discussed sexually transmitted diseases, partner selection, use of condoms, avoidance of unintended pregnancy  and contraceptive alternatives.   [x]   Dental health: Discussed importance of regular tooth brushing, flossing, and dental visits.  [x]   Health maintenance and immunizations reviewed. Please refer to Health maintenance section.     CMA or LPN served as scribe during this visit. History, Physical, and Plan performed by medical provider. Documentation and orders reviewed and attested to.  Jarold Motto,  PA-C [place] [facility]

## 2017-05-27 ENCOUNTER — Ambulatory Visit
Admission: RE | Admit: 2017-05-27 | Discharge: 2017-05-27 | Disposition: A | Discharge status: Home / Self Care | Payer: 59 | Source: Ambulatory Visit | Attending: Physician Assistant | Admitting: Physician Assistant

## 2017-05-27 DIAGNOSIS — R1011 Right upper quadrant pain: Secondary | ICD-10-CM

## 2017-10-27 ENCOUNTER — Ambulatory Visit: Payer: 59 | Admitting: Sports Medicine

## 2017-10-27 ENCOUNTER — Encounter: Payer: Self-pay | Admitting: Physician Assistant

## 2017-10-27 ENCOUNTER — Encounter: Payer: Self-pay | Admitting: Sports Medicine

## 2017-10-27 ENCOUNTER — Ambulatory Visit (INDEPENDENT_AMBULATORY_CARE_PROVIDER_SITE_OTHER): Payer: 59 | Admitting: Physician Assistant

## 2017-10-27 VITALS — BP 140/90 | HR 67 | Ht 71.0 in | Wt 191.0 lb

## 2017-10-27 VITALS — BP 140/90 | HR 67 | Temp 98.2°F | Ht 71.0 in | Wt 191.0 lb

## 2017-10-27 DIAGNOSIS — M9905 Segmental and somatic dysfunction of pelvic region: Secondary | ICD-10-CM

## 2017-10-27 DIAGNOSIS — M24551 Contracture, right hip: Secondary | ICD-10-CM

## 2017-10-27 DIAGNOSIS — M9902 Segmental and somatic dysfunction of thoracic region: Secondary | ICD-10-CM | POA: Diagnosis not present

## 2017-10-27 DIAGNOSIS — M5441 Lumbago with sciatica, right side: Secondary | ICD-10-CM

## 2017-10-27 DIAGNOSIS — G8929 Other chronic pain: Secondary | ICD-10-CM

## 2017-10-27 DIAGNOSIS — M9903 Segmental and somatic dysfunction of lumbar region: Secondary | ICD-10-CM

## 2017-10-27 DIAGNOSIS — M79604 Pain in right leg: Secondary | ICD-10-CM

## 2017-10-27 DIAGNOSIS — M5417 Radiculopathy, lumbosacral region: Secondary | ICD-10-CM

## 2017-10-27 MED ORDER — GABAPENTIN 300 MG PO CAPS
ORAL_CAPSULE | ORAL | 1 refills | Status: DC
Start: 2017-10-27 — End: 2018-01-04

## 2017-10-27 NOTE — Progress Notes (Signed)
Tristan Davenport. Tristan Davenport Sports Medicine [facility] [id number]  Tristan Davenport - 37 y.o. male MRN [id number]  Date of birth: December 17, 1980  Visit Date: 10/27/2017  PCP: Jarold Motto, PA   Referred by: Jarold Motto, [name]  Scribe(s) for today's visit: [name], CMA  SUBJECTIVE:  Tristan Julian "Trey Paula" is here for Initial Assessment (R hip pain) .  Referred by: Jarold Motto, PA  His R hip pain symptoms INITIALLY: Began more than 1 week ago and MOI is unknown. Gradual onset stemming from lower back injury. He injured his back doing dead ifts in mid 08-09-2017.  Described as moderate-severe (8/10) aching, radiating to the R thigh Worsened with hip extension, movement, stretching Improved with , generally anything that isn't stretching.  Additional associated symptoms include: pain is present in the right hip and right thigh, loss of sensation and numbness present. Pain radiates along the lateral aspect of the thigh. He denies decreased ROM, muscle weakness or tingling. When he is at rest he has numbness "like novocain" on the lateral aspect of the thigh. He reports burning, tearing, ripping pain with stretching. If he does crescent while in forward lunge that is when the pain is worse. He does report occasional pain in the R groin that radiates to the R testicle.     At this time symptoms have been fluctuating since onset. He has been tried heat, ice, and Yoga for sx with minimal relief. He has tried alternating hot and cold with some relief.   XR L-spine 05/18/17.     REVIEW OF SYSTEMS: Reports night time disturbances. Denies fevers, chills, or night sweats. Denies unexplained weight loss. Denies personal history of cancer. Denies changes in bowel or bladder habits. Denies recent unreported falls. Denies new or worsening dyspnea or wheezing. Denies headaches or dizziness.  Reports numbness, tingling or weakness  In the extremities.    Denies dizziness or presyncopal episodes Denies lower extremity edema    HISTORY & PERTINENT PRIOR DATA:  Prior History reviewed and updated per electronic medical record.  Significant/pertinent history, findings, studies include:  reports that he quit smoking about 10 years ago. His smoking use included cigarettes. He quit smokeless tobacco use about 4 years ago. His smokeless tobacco use included snuff. No results for input(s): HGBA1C, LABURIC, CREATINE in the last 8760 hours. No specialty comments available. No problems updated.  OBJECTIVE:  VS:  HT:5\' 11"  (180.3 cm)   WT:191 lb (86.6 kg)  BMI:26.65    BP:140/90  HR:67bpm  TEMP: ( )  RESP:97 %   PHYSICAL EXAM: Constitutional: WDWN, Non-toxic appearing. Psychiatric: Alert & appropriately interactive.  Not depressed or anxious appearing. Respiratory: No increased work of breathing.  Trachea Midline Eyes: Pupils are equal.  EOM intact without nystagmus.  No scleral icterus  Vascular Exam: warm to touch no edema  lower extremity neuro exam: normal strength normal reflexes Abnormal sensation in the right L2 dermatome.  MSK Exam: Right hip flexor tightness.  He has good internal and external rotation of bilateral hips.  He is in an anterior chain dominant position.    ASSESSMENT & PLAN:   1. Right hip flexor tightness   2. Chronic right-sided low back pain with right-sided sciatica   3. Somatic dysfunction of thoracic region   4. Somatic dysfunction of lumbar region   5. Somatic dysfunction of pelvis region   6. Lumbosacral radiculopathy at L2     PLAN: Osteopathic manipulation was performed today based  on physical exam findings.  Please see procedure note for further information including Osteopathic Exam findings  Discussed the foundation of treatment for this condition is physical therapy and/or daily (5-6 days/week) therapeutic exercises, focusing on core strengthening, coordination, neuromuscular  control/reeducation.  Therapeutic exercises prescribed per procedure note.  Ultimately is markedly tight right hip flexor as well as an L2 radiculitis.  His reflexes and strength are normal however and he should benefit from changing his anterior chain dominant recruitment pattern.    Links to [employer] videos provided today per Patient Instructions.  These exercises were developed by Myles LippsEric Goodman, DC with a strong emphasis on core neuromuscular reducation and postural realignment through body-weight exercises.  Trial of gabapentin.  Any lack of improvement consider MRI.   Follow-up: Return in about 1 month (around 11/24/2017).        Please see additional documentation for Objective, Assessment and Plan sections. Pertinent additional documentation may be included in corresponding procedure notes, imaging studies, problem based documentation and patient instructions. Please see these sections of the encounter for additional information regarding this visit.  CMA/ATC served as Neurosurgeonscribe during this visit. History, Physical, and Plan performed by medical provider. Documentation and orders reviewed and attested to.      Tristan MewsMichael D Yasuko Lapage, DO    [facility] Physician

## 2017-10-27 NOTE — Progress Notes (Signed)
PROCEDURE NOTE: THERAPEUTIC EXERCISES (97110) 15 minutes spent for Therapeutic exercises as below and as referenced in the AVS.  This included exercises focusing on stretching, strengthening, with significant focus on eccentric aspects.   Proper technique shown and discussed handout in great detail with ATC.  All questions were discussed and answered.   Long term goals include an improvement in range of motion, strength, endurance as well as avoiding reinjury. Frequency of visits is one time as determined during today's  office visit. Frequency of exercises to be performed is as per handout.  EXERCISES REVIEWED: Thoracic Mobility Goodman Exercises hip flexor stretching

## 2017-10-27 NOTE — Progress Notes (Signed)
Patient was not seen by me, was instead seen by Dr. Gaspar BiddingMichael Rigby. No charge.

## 2017-10-27 NOTE — Patient Instructions (Signed)
Also check out [employer]"Foundation Training" which is a program developed by Dr. Myles LippsEric Goodman.   There are links to a couple of his YouTube Videos below and I would like to see performing one of his videos 5-6 days per week.    A good intro video is: "Independence from Pain 7-minute Video" - [email or web address]   Exercises that focus more on the neck are as below: Dr. Derrill KayGoodman with [employer] teaching neck and shoulder details Part 1 - https://[email or web address] Part 2 Dr. Derrill KayGoodman with [facility] quick routine to practice daily - https://[email or web address].[email or web address]  Do not try to attempt the entire video when first beginning.    Try breaking of each exercise that he goes into shorter segments.  Otherwise if they perform an exercise for 45 seconds, start with 15 seconds and rest and then resume when they begin the new activity.  If you work your way up to being able to do these videos without having to stop, I expect you will see significant improvements in your pain.  If you enjoy his videos and would like to find out more you can look on his website: [email or web address].  He has a workout streaming option as well as a DVD set available for purchase.  Amazon has the best price for his DVDs.     Please perform the exercise program that we have prepared for you and gone over in detail on a daily basis.  In addition to the handout you were provided you can access your program through: [email or web address]   Your unique program code is:  [facility] [email or web address]

## 2017-10-27 NOTE — Progress Notes (Signed)
PROCEDURE NOTE : OSTEOPATHIC MANIPULATION The decision today to treat with Osteopathic Manipulative Therapy (OMT) was based on physical exam findings. Verbal consent was obtained following a discussion with the patient regarding the of risks, benefits and potential side effects, including an acute pain flare,post manipulation soreness and need for repeat treatments.     Contraindications to OMT reviewed and include: NONE  Manipulation was performed as below: Regions treated: Thoracic spine, Lumbar spine and Pelvis OMT Techniques Used: HVLA, muscle energy and myofascial release  The patient tolerated the treatment well and reported Improved symptoms following treatment today. Patient was given medications, exercises, stretches and lifestyle modifications per AVS and verbally.   OSTEOPATHIC/STRUCTURAL EXAM:   Right anterior innominate L2 FRS right T8-L1 neutral, rotated left, side bent right

## 2017-10-28 ENCOUNTER — Other Ambulatory Visit: Payer: Self-pay | Admitting: Physician Assistant

## 2017-10-28 ENCOUNTER — Encounter: Payer: Self-pay | Admitting: Physician Assistant

## 2017-10-28 DIAGNOSIS — Z3009 Encounter for other general counseling and advice on contraception: Secondary | ICD-10-CM

## 2017-11-23 ENCOUNTER — Encounter: Payer: Self-pay | Admitting: Sports Medicine

## 2017-11-26 ENCOUNTER — Ambulatory Visit: Payer: 59 | Admitting: Sports Medicine

## 2018-01-01 ENCOUNTER — Ambulatory Visit: Payer: 59 | Admitting: Sports Medicine

## 2018-01-04 ENCOUNTER — Ambulatory Visit: Payer: 59 | Admitting: Sports Medicine

## 2018-01-04 ENCOUNTER — Encounter: Payer: Self-pay | Admitting: Sports Medicine

## 2018-01-04 VITALS — BP 148/100 | HR 74 | Ht 71.0 in | Wt 189.0 lb

## 2018-01-04 DIAGNOSIS — M9903 Segmental and somatic dysfunction of lumbar region: Secondary | ICD-10-CM

## 2018-01-04 DIAGNOSIS — M5441 Lumbago with sciatica, right side: Secondary | ICD-10-CM | POA: Diagnosis not present

## 2018-01-04 DIAGNOSIS — M9902 Segmental and somatic dysfunction of thoracic region: Secondary | ICD-10-CM

## 2018-01-04 DIAGNOSIS — M24551 Contracture, right hip: Secondary | ICD-10-CM

## 2018-01-04 DIAGNOSIS — M9905 Segmental and somatic dysfunction of pelvic region: Secondary | ICD-10-CM

## 2018-01-04 DIAGNOSIS — M5417 Radiculopathy, lumbosacral region: Secondary | ICD-10-CM

## 2018-01-04 DIAGNOSIS — G8929 Other chronic pain: Secondary | ICD-10-CM

## 2018-01-04 DIAGNOSIS — M79604 Pain in right leg: Secondary | ICD-10-CM
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
## 2018-01-04 NOTE — Progress Notes (Signed)
PROCEDURE NOTE : OSTEOPATHIC MANIPULATION The decision today to treat with Osteopathic Manipulative Therapy (OMT) was based on physical exam findings. Verbal consent was obtained following a discussion with the patient regarding the of risks, benefits and potential side effects, including an acute pain flare,post manipulation soreness and need for repeat treatments.     Contraindications to OMT: NONE  Manipulation was performed as below: Regions Treated OMT Techniques Used  Thoracic spine Lumbar spine Pelvis HVLA muscle energy myofascial release articulatory   The patient tolerated the treatment well and reported Improved symptoms following treatment today. Patient was given medications, exercises, stretches and lifestyle modifications per AVS and verbally.   OSTEOPATHIC/STRUCTURAL EXAM:   T3 FRS right (Flexed, Rotated & Sidebent) T6 ERS left (Extended, Rotated & Sidebent) T11 FRS right (Flexed, Rotated & Sidebent) L4 FRS left (Flexed, Rotated & Sidebent) Right psoas spasm Right anterior innonimate

## 2018-01-27 ENCOUNTER — Telehealth: Payer: Self-pay | Admitting: Physician Assistant

## 2018-01-27 NOTE — Telephone Encounter (Signed)
See note

## 2018-01-27 NOTE — Telephone Encounter (Unsigned)
Copied from CRM [id number]. Topic: Quick Communication - Rx Refill/Question >> Jan 27, 2018  4:01 PM Mcneil, Ja-Kwan wrote: Medication: lisinopril (PRINIVIL,ZESTRIL) 10 MG tablet  Has the patient contacted their pharmacy? yes   Preferred Pharmacy (with phone number or street name): [facility] [id number], [place] - [address] [phone] (Phone) [phone] (Fax)  Agent: Please be advised that RX refills may take up to 3 business days. We ask that you follow-up with your pharmacy.

## 2018-01-28 ENCOUNTER — Other Ambulatory Visit: Payer: Self-pay

## 2018-01-28 DIAGNOSIS — I10 Essential (primary) hypertension: Secondary | ICD-10-CM

## 2018-01-28 MED ORDER — LISINOPRIL 10 MG PO TABS: 10.0000 mg | ORAL_TABLET | Freq: Every day | ORAL | 1 refills | Status: DC

## 2018-01-28 NOTE — Telephone Encounter (Signed)
Refill sent.

## 2018-02-09 ENCOUNTER — Ambulatory Visit: Payer: 59 | Admitting: Sports Medicine

## 2018-05-11 ENCOUNTER — Encounter: Payer: Self-pay | Admitting: Family Medicine

## 2018-05-11 ENCOUNTER — Ambulatory Visit: Payer: 59 | Admitting: Physician Assistant

## 2018-05-11 ENCOUNTER — Ambulatory Visit (INDEPENDENT_AMBULATORY_CARE_PROVIDER_SITE_OTHER): Payer: 59 | Admitting: Family Medicine

## 2018-05-11 VITALS — BP 120/76 | HR 74 | Temp 97.8°F | Ht 71.0 in | Wt 196.8 lb

## 2018-05-11 DIAGNOSIS — R197 Diarrhea, unspecified: Secondary | ICD-10-CM | POA: Diagnosis not present

## 2018-05-11 DIAGNOSIS — K200 Eosinophilic esophagitis: Secondary | ICD-10-CM | POA: Diagnosis not present

## 2018-05-11 DIAGNOSIS — Z7289 Other problems related to lifestyle: Secondary | ICD-10-CM | POA: Diagnosis not present

## 2018-05-11 DIAGNOSIS — R1011 Right upper quadrant pain: Secondary | ICD-10-CM | POA: Diagnosis not present

## 2018-05-11 DIAGNOSIS — Z789 Other specified health status: Secondary | ICD-10-CM

## 2018-05-11 NOTE — Progress Notes (Signed)
Lafredrick Islas is a 38 y.o. male here for an acute visit.  History of Present Illness:   Barnie Mort, CMA, scribe for Dr. Earlene Plater.   Diarrhea   This is a new problem. The current episode started 1 to 4 weeks ago. The problem occurs 5 to 10 times per day. The problem has been unchanged. The patient states that diarrhea does not awaken him from sleep. Associated symptoms include abdominal pain and bloating. Pertinent negatives include no chills, fever, sweats, vomiting or weight loss. Exacerbated by: eating at all  There are no known risk factors. He has tried change of diet, anti-motility drug and increased fluids for the symptoms. The treatment provided no relief. There is no history of bowel resection, irritable bowel syndrome or a recent abdominal surgery.   PMHx, SurgHx, SocialHx, Medications, and Allergies were reviewed in the Visit Navigator and updated as appropriate.  Current Medications   .  arginine 500 MG tablet, Take 500 mg by mouth daily., Disp: , Rfl:  .  CITRULLINE PO, Take 3 capsules by mouth daily. 250 mg each, Disp: , Rfl:  .  lisinopril (PRINIVIL,ZESTRIL) 10 MG tablet, Take 1 tablet (10 mg total) by mouth daily., Disp: 90 tablet, Rfl: 1 .  NON FORMULARY, , Disp: , Rfl:    Allergies  Allergen Reactions  . Penicillins    Review of Systems   Pertinent items are noted in the HPI. Otherwise, ROS is negative.  Vitals   Vitals:   05/11/18 1556  BP: 120/76  Pulse: 74  Temp: 97.8 F (36.6 C)  TempSrc: Oral  SpO2: 97%  Weight: 196 lb 12.8 oz (89.3 kg)  Height: 5\' 11"  (1.803 m)     Body mass index is 27.45 kg/m.  Physical Exam   Physical Exam Vitals signs and nursing note reviewed.  Constitutional:      General: He is not in acute distress.    Appearance: He is well-developed.  HENT:     Head: Normocephalic and atraumatic.     Right Ear: External ear normal.     Left Ear: External ear normal.     Nose: Nose normal.  Eyes:   Conjunctiva/sclera: Conjunctivae normal.     Pupils: Pupils are equal, round, and reactive to light.  Neck:     Musculoskeletal: Normal range of motion and neck supple.  Cardiovascular:     Rate and Rhythm: Normal rate and regular rhythm.     Heart sounds: Normal heart sounds.  Pulmonary:     Effort: Pulmonary effort is normal.     Breath sounds: Normal breath sounds.  Abdominal:     General: Bowel sounds are normal. There is no distension.     Palpations: Abdomen is soft. There is no mass.     Tenderness: There is abdominal tenderness in the right upper quadrant and periumbilical area.  Musculoskeletal: Normal range of motion.  Skin:    General: Skin is warm and dry.  Neurological:     Mental Status: He is alert and oriented to person, place, and time.  Psychiatric:        Behavior: Behavior normal.        Thought Content: Thought content normal.        Judgment: Judgment normal.    Assessment and Plan   Mckenzie was seen today for diarrhea, x2 weeks, worsening, without overt blood with darker stools.  He has been taking generic Pepto-Bismol intermittently.  History of esophagitis.  He has been told to  take Prilosec, though he does not take it regularly.  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  Eats well most of the time.  Endorses alcohol use, whiskey, 2 to 3 glasses per night on his nights off which should be around 3-4 times per week.  He has some right sided abdominal pain intermittently.  No nausea or vomiting.  No fever.  No known exposures.  No sick contacts.  He has restarted Prilosec and decreased alcohol intake since the diarrhea began.  Differential includes esophagitis, gastritis, peptic ulcer, gallbladder etiology, liver etiology.  Will rule out most concerning issues first with labs and ultrasound.  I have encouraged him to take his PPI regularly.  Okay for Imodium if needed.  I recommend no alcohol intake.  If not improving and if labs or ultrasound is normal, I recommend  referral to gastroenterology.  Diagnoses and all orders for this visit:  Right upper quadrant abdominal pain -     CBC with Differential/Platelet -     Comprehensive metabolic panel -     Lipase -     US ABDOMEN LIMITED RUQ  Diarrhea, unspecified type -     CBC with Differential/Platelet -     Comprehensive metabolic panel -     Lipase -     US ABDOMEN LIMITED RUQ  Eosinophilic esophagitis  Alcohol use   . Reviewed expectations re: course of current medical issues. . Discussed self-management of symptoms. . Outlined signs and symptoms indicating need for more acute intervention. . Patient verbalized understanding and all questions were answered. Marland Kitchen Health Maintenance issues including appropriate healthy diet, exercise, and smoking avoidance were discussed with patient. . See orders for this visit as documented in the electronic medical record. . Patient received an After Visit Summary.  CMA served as Neurosurgeon during this visit. History, Physical, and Plan performed by medical provider. The above documentation has been reviewed and is accurate and complete. Helane Rima, D.O.  Helane Rima, DO [place] 05/12/2018

## 2018-05-12 ENCOUNTER — Other Ambulatory Visit: Payer: 59

## 2018-05-12 ENCOUNTER — Encounter: Payer: Self-pay | Admitting: Family Medicine

## 2018-05-12 LAB — COMPREHENSIVE METABOLIC PANEL
ALT: 22 U/L (ref 0–53)
AST: 23 U/L (ref 0–37)
Albumin: 4 g/dL (ref 3.5–5.2)
Alkaline Phosphatase: 41 U/L (ref 39–117)
BUN: 22 mg/dL (ref 6–23)
CO2: 27 mEq/L (ref 19–32)
Calcium: 9.4 mg/dL (ref 8.4–10.5)
Chloride: 102 mEq/L (ref 96–112)
Creatinine, Ser: 1.02 mg/dL (ref 0.40–1.50)
GFR: 81.05 mL/min (ref >60.00)
Glucose, Bld: 80 mg/dL (ref 70–99)
Potassium: 4.2 mEq/L (ref 3.5–5.1)
Sodium: 138 mEq/L (ref 135–145)
Total Bilirubin: 0.4 mg/dL (ref 0.2–1.2)
Total Protein: 5.7 g/dL — ABNORMAL LOW (ref 6.0–8.3)

## 2018-05-12 LAB — CBC WITH DIFFERENTIAL/PLATELET
Basophils Absolute: 0.1 10*3/uL (ref 0.0–0.1)
Basophils Relative: 0.5 % (ref 0.0–3.0)
Eosinophils Absolute: 4.3 10*3/uL — ABNORMAL HIGH (ref 0.0–0.7)
Eosinophils Relative: 35.8 % — ABNORMAL HIGH (ref 0.0–5.0)
HCT: 45.1 % (ref 39.0–52.0)
Hemoglobin: 15.4 g/dL (ref 13.0–17.0)
Lymphocytes Relative: 20.4 % (ref 12.0–46.0)
Lymphs Abs: 2.5 10*3/uL (ref 0.7–4.0)
MCHC: 34.3 g/dL (ref 30.0–36.0)
MCV: 92.3 fl (ref 78.0–100.0)
Monocytes Absolute: 0.7 10*3/uL (ref 0.1–1.0)
Monocytes Relative: 6.1 % (ref 3.0–12.0)
Neutro Abs: 4.5 10*3/uL (ref 1.4–7.7)
Neutrophils Relative %: 37.2 % — ABNORMAL LOW (ref 43.0–77.0)
Platelets: 232 10*3/uL (ref 150.0–400.0)
RBC: 4.88 Mil/uL (ref 4.22–5.81)
RDW: 13.3 % (ref 11.5–15.5)
WBC: 12.1 10*3/uL — ABNORMAL HIGH (ref 4.0–10.5)

## 2018-05-12 LAB — LIPASE: Lipase: 53 U/L (ref 11.0–59.0)

## 2018-05-14 ENCOUNTER — Ambulatory Visit
Admission: RE | Admit: 2018-05-14 | Discharge: 2018-05-14 | Disposition: A | Discharge status: Home / Self Care | Payer: 59 | Source: Ambulatory Visit | Attending: Family Medicine | Admitting: Family Medicine

## 2018-05-17 ENCOUNTER — Encounter: Payer: Self-pay | Admitting: Family Medicine

## 2018-05-19 MED ORDER — HYOSCYAMINE SULFATE SL 0.125 MG SL SUBL: 1.0000 | SUBLINGUAL_TABLET | Freq: Four times a day (QID) | SUBLINGUAL | 2 refills | Status: DC | PRN

## 2018-07-16 ENCOUNTER — Telehealth: Payer: Self-pay | Admitting: *Deleted

## 2018-07-16 NOTE — Telephone Encounter (Signed)
Left message on voicemail to call office. Needs to schedule follow up for blood pressure with Samantha.

## 2018-07-20 NOTE — Telephone Encounter (Signed)
Left message on voicemail to call office.  

## 2019-06-06 NOTE — Progress Notes (Signed)
I acted as a Education administrator for [employer], PA-C Anselmo Pickler, LPN  Subjective:    Tristan Davenport is a 39 y.o. male and is here for a comprehensive physical exam.  HPI  There are no preventive care reminders to display for this patient.  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  HTN  -- Currently taking no medication. At home blood pressure readings are: 140/90. Patient denies chest pain, SOB, blurred vision, dizziness, unusual headaches, lower leg swelling. Patient is not taking previously prescribed Lisinopril 10 mg. Denies excessive caffeine intake, stimulant usage, excessive alcohol intake, or increase in salt consumption.  BP Readings from Last 3 Encounters:  06/07/19 (!) 150/100  05/11/18 120/76  01/04/18 (!) 148/100   Anxiety and depression -- seeing therapist in person. Feels like his mood is "largely in check." Having issues with anger and PTSD.   GAD 7 : Generalized Anxiety Score 06/07/2019  Nervous, Anxious, on Edge 3  Control/stop worrying 1  Worry too much - different things 1  Trouble relaxing 2  Restless 0  Easily annoyed or irritable 3  Afraid - awful might happen 2  Total GAD 7 Score 12  Anxiety Difficulty Very difficult      Office Visit from 06/07/2019 in [place]  PHQ-9 Total Score  13     Health Maintenance: Immunizations -- UTD, declines Flu Colonoscopy -- N/A PSA -- N/A Diet -- overall healthy Caffeine intake -- was drinking a lot of coffee but scaled back recently; occasional pre-workout Sleep habits -- light sleeper, gets a few hours a night; has tried melatonin in the past Exercise -- paddle boarding, very active Weight -- Weight: 202 lb 6.1 oz (91.8 kg)  Weight history Wt Readings from Last 10 Encounters:  06/07/19 202 lb 6.1 oz (91.8 kg)  05/11/18 196 lb 12.8 oz (89.3 kg)  01/04/18 189 lb (85.7 kg)  10/27/17 191 lb (86.6 kg)  10/27/17 191 lb (86.6 kg)  05/18/17 194 lb 6.1 oz (88.2 kg)  07/22/16  193 lb 6 oz (87.7 kg)  06/23/16 199 lb (90.3 kg)  05/27/16 199 lb (90.3 kg)  05/22/16 202 lb (91.6 kg)  Body mass index is 28.23 kg/m. Mood -- see above Tobacco use -- none Alcohol use --- heavy drinking at times; but denies significant concerned  Depression screen [facility] 2/9 06/07/2019  Decreased Interest 1  Down, Depressed, Hopeless 2  PHQ - 2 Score 3  Altered sleeping 3  Tired, decreased energy 2  Change in appetite 0  Feeling bad or failure about yourself  2  Trouble concentrating 3  Moving slowly or fidgety/restless 0  Suicidal thoughts 0  PHQ-9 Score 13  Difficult doing work/chores Somewhat difficult   Did not get COVID-19 and did not get the vaccine.  Other providers/specialists: Patient Care Team: Inda Coke, [place] as PCP - General (Physician Assistant)   PMHx, SurgHx, SocialHx, Medications, and Allergies were reviewed in the Visit Navigator and updated as appropriate.   Past Medical History:  Diagnosis Date  . Cluster headaches    last one was 5 year  . Eosinophilic esophagitis 8003   biopsies positive on 05/22/16; followed by GI    History reviewed. No pertinent surgical history.   Family History  Problem Relation Age of Onset  . Stroke Mother 45  . Hypertension Father   . Other Father        Giant Cell Arteririts and Polymyalgia Rheumatica  . Anorexia nervosa Sister   . Parkinson's disease  Maternal Grandfather   . Emphysema Paternal Grandfather   . Colon cancer Neg Hx   . Stomach cancer Neg Hx     Social History   Tobacco Use  . Smoking status: Former Smoker    Types: Cigarettes    Quit date: 05/07/2007    Years since quitting: 12.0  . Smokeless tobacco: Former Neurosurgeon    Types: Snuff    Quit date: 05/06/2013  Substance Use Topics  . Alcohol use: Yes    Alcohol/week: 12.0 standard drinks    Types: 12 Shots of liquor per week  . Drug use: No    Review of Systems:   Review of Systems  Constitutional: Negative for chills, fever,  malaise/fatigue and weight loss.  HENT: Negative for hearing loss, sinus pain and sore throat.   Respiratory: Negative for cough and hemoptysis.   Cardiovascular: Negative for chest pain, palpitations, leg swelling and PND.  Gastrointestinal: Negative for abdominal pain, constipation, diarrhea, heartburn, nausea and vomiting.  Genitourinary: Negative for dysuria, frequency and urgency.  Musculoskeletal: Negative for back pain, myalgias and neck pain.  Skin: Negative for itching and rash.  Neurological: Negative for dizziness, tingling, seizures and headaches.  Endo/Heme/Allergies: Negative for polydipsia.  Psychiatric/Behavioral: Negative for depression. The patient is not nervous/anxious.     Objective:   Vitals:   06/07/19 0901  BP: (!) 150/100  Pulse: 65  Temp: 98.4 F (36.9 C)  SpO2: 96%   Body mass index is 28.23 kg/m.  General Appearance:  Alert, cooperative, no distress, appears stated age  Head:  Normocephalic, without obvious abnormality, atraumatic  Eyes:  PERRL, conjunctiva/corneas clear, EOM's intact, fundi benign, both eyes       Ears:  Normal TM's and external ear canals, both ears  Nose: Nares normal, septum midline, mucosa normal, no drainage    or sinus tenderness  Throat: Lips, mucosa, and tongue normal; teeth and gums normal  Neck: Supple, symmetrical, trachea midline, no adenopathy; thyroid:  No enlargement/tenderness/nodules; no carotit bruit or JVD  Back:   Symmetric, no curvature, ROM normal, no CVA tenderness  Lungs:   Clear to auscultation bilaterally, respirations unlabored  Chest wall:  No tenderness or deformity  Heart:  Regular rate and rhythm, S1 and S2 normal, no murmur, rub   or gallop  Abdomen:   Soft, non-tender, bowel sounds active all four quadrants, no masses, no organomegaly  Extremities: Extremities normal, atraumatic, no cyanosis or edema  Prostate: Not done.   Skin: Skin color, texture, turgor normal, no rashes or lesions  Lymph nodes:  Cervical, supraclavicular, and axillary nodes normal  Neurologic: CNII-XII grossly intact. Normal strength, sensation and reflexes throughout    Assessment/Plan:   Tristan Davenport was seen today for annual exam.  Diagnoses and all orders for this visit:  Routine physical examination Today patient counseled on age appropriate routine health concerns for screening and prevention, each reviewed and up to date or declined. Immunizations reviewed and up to date or declined. Labs ordered and reviewed. Risk factors for depression reviewed and negative. Hearing function and visual acuity are intact. ADLs screened and addressed as needed. Functional ability and level of safety reviewed and appropriate. Education, counseling and referrals performed based on assessed risks today. Patient provided with a copy of personalized plan for preventive services.  Essential hypertension Refill lisinopril 10 mg daily. Follow-up in 1 year, sooner if concerns. -     lisinopril (ZESTRIL) 10 MG tablet; Take 1 tablet (10 mg total) by mouth daily.  Anxiety and  depression Overall well managed with therapy per patient. Declines medication. I discussed with patient that if they develop any SI, to tell someone immediately and seek medical attention.   Well Adult Exam: Labs ordered: No. Patient counseling was done. See below for items discussed. Discussed the patient's BMI.  The BMI is in the acceptable range -- patient with muscular body habitus and is at appropriate weight for height. Follow up as needed for acute illness.  Patient Counseling: [x]   Nutrition: Stressed importance of moderation in sodium/caffeine intake, saturated fat and cholesterol, caloric balance, sufficient intake of fresh fruits, vegetables, and fiber.  [x]   Stressed the importance of regular exercise.   []   Substance Abuse: Discussed cessation/primary prevention of tobacco, alcohol, or other drug use; driving or other dangerous activities under the  influence; availability of treatment for abuse.   [x]   Injury prevention: Discussed safety belts, safety helmets, smoke detector, smoking near bedding or upholstery.   []   Sexuality: Discussed sexually transmitted diseases, partner selection, use of condoms, avoidance of unintended pregnancy  and contraceptive alternatives.   [x]   Dental health: Discussed importance of regular tooth brushing, flossing, and dental visits.  [x]   Health maintenance and immunizations reviewed. Please refer to Health maintenance section.    CMA or LPN served as scribe during this visit. History, Physical, and Plan performed by medical provider. The above documentation has been reviewed and is accurate and complete.   , PA-C [facility]

## 2019-06-07 ENCOUNTER — Other Ambulatory Visit: Payer: Self-pay

## 2019-06-07 ENCOUNTER — Ambulatory Visit (INDEPENDENT_AMBULATORY_CARE_PROVIDER_SITE_OTHER): Payer: 59 | Admitting: Physician Assistant

## 2019-06-07 ENCOUNTER — Encounter: Payer: Self-pay | Admitting: Physician Assistant

## 2019-06-07 VITALS — BP 150/100 | HR 65 | Temp 98.4°F | Ht 71.0 in | Wt 202.4 lb

## 2019-06-07 DIAGNOSIS — I10 Essential (primary) hypertension: Secondary | ICD-10-CM

## 2019-06-07 DIAGNOSIS — Z0000 Encounter for general adult medical examination without abnormal findings: Secondary | ICD-10-CM

## 2019-06-07 DIAGNOSIS — F419 Anxiety disorder, unspecified: Secondary | ICD-10-CM

## 2019-06-07 DIAGNOSIS — F329 Major depressive disorder, single episode, unspecified: Secondary | ICD-10-CM

## 2019-06-07 DIAGNOSIS — F32A Depression, unspecified: Secondary | ICD-10-CM

## 2019-06-07 MED ORDER — LISINOPRIL 10 MG PO TABS: 10.0000 mg | ORAL_TABLET | Freq: Every day | ORAL | 1 refills | Status: AC

## 2019-06-12 IMAGING — DX DG LUMBAR SPINE 2-3V
3 series · 3 of 3 positions shown · non-contrast
Comparison: None.

CLINICAL DATA: Lumbago for 6 months

EXAM:
LUMBAR SPINE - 2-3 VIEW

[lumbar spine ap]
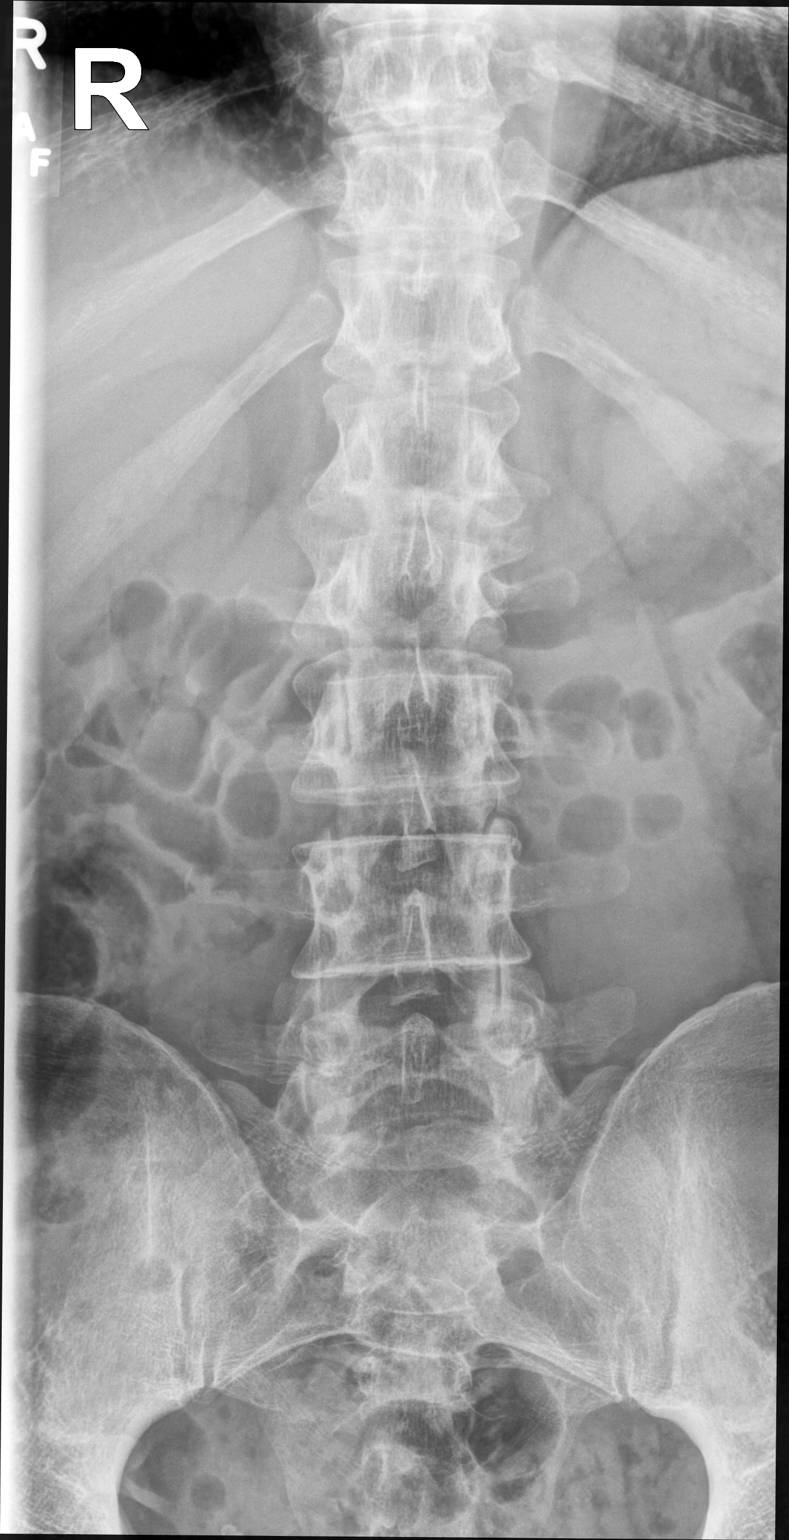

[lumbar spine lat (1 of 2)]
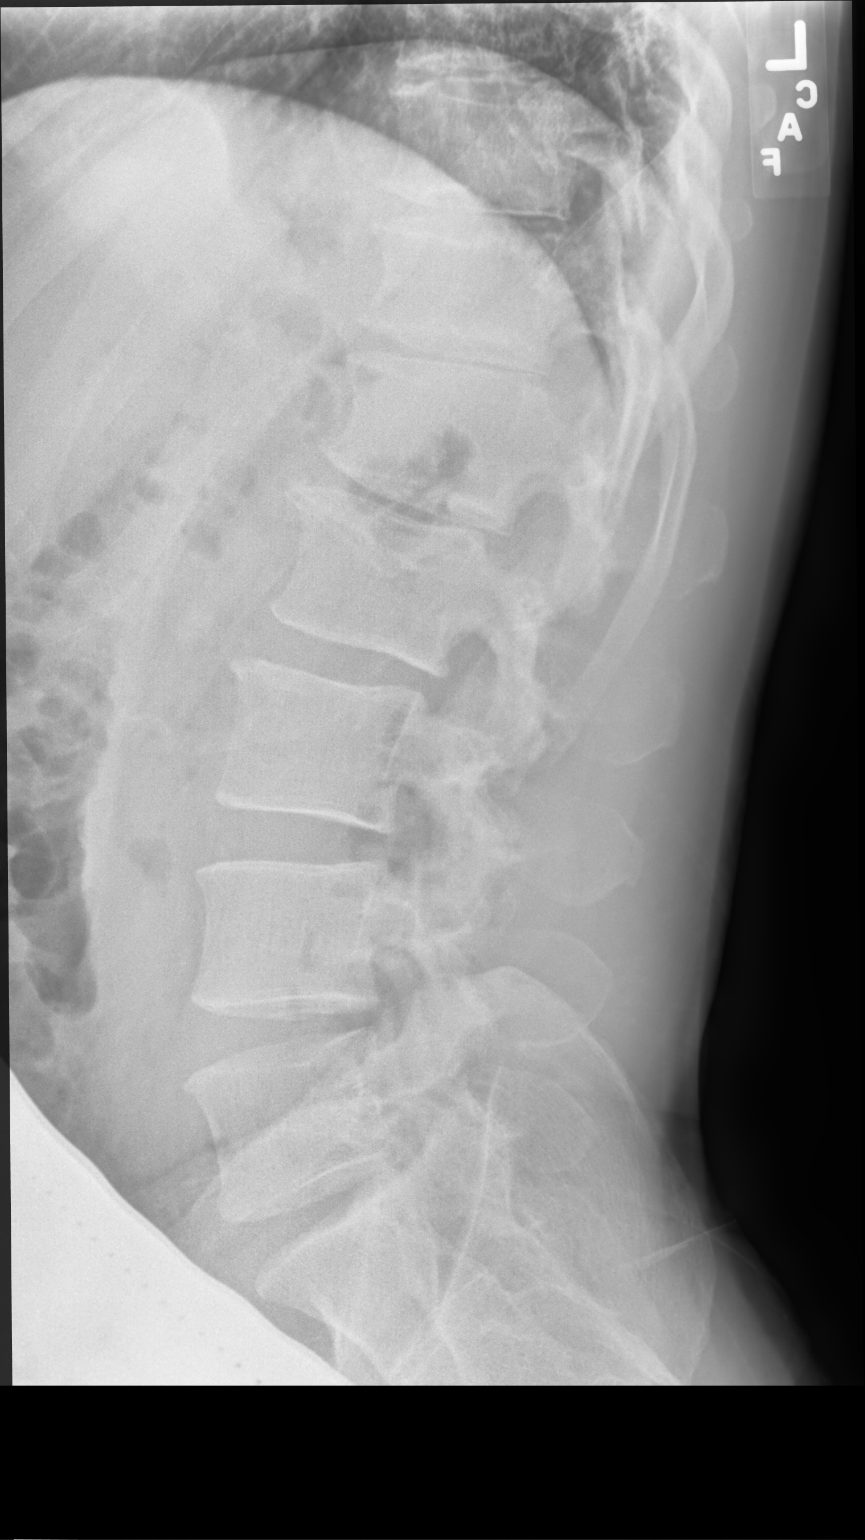

[lumbar spine lat (2 of 2)]
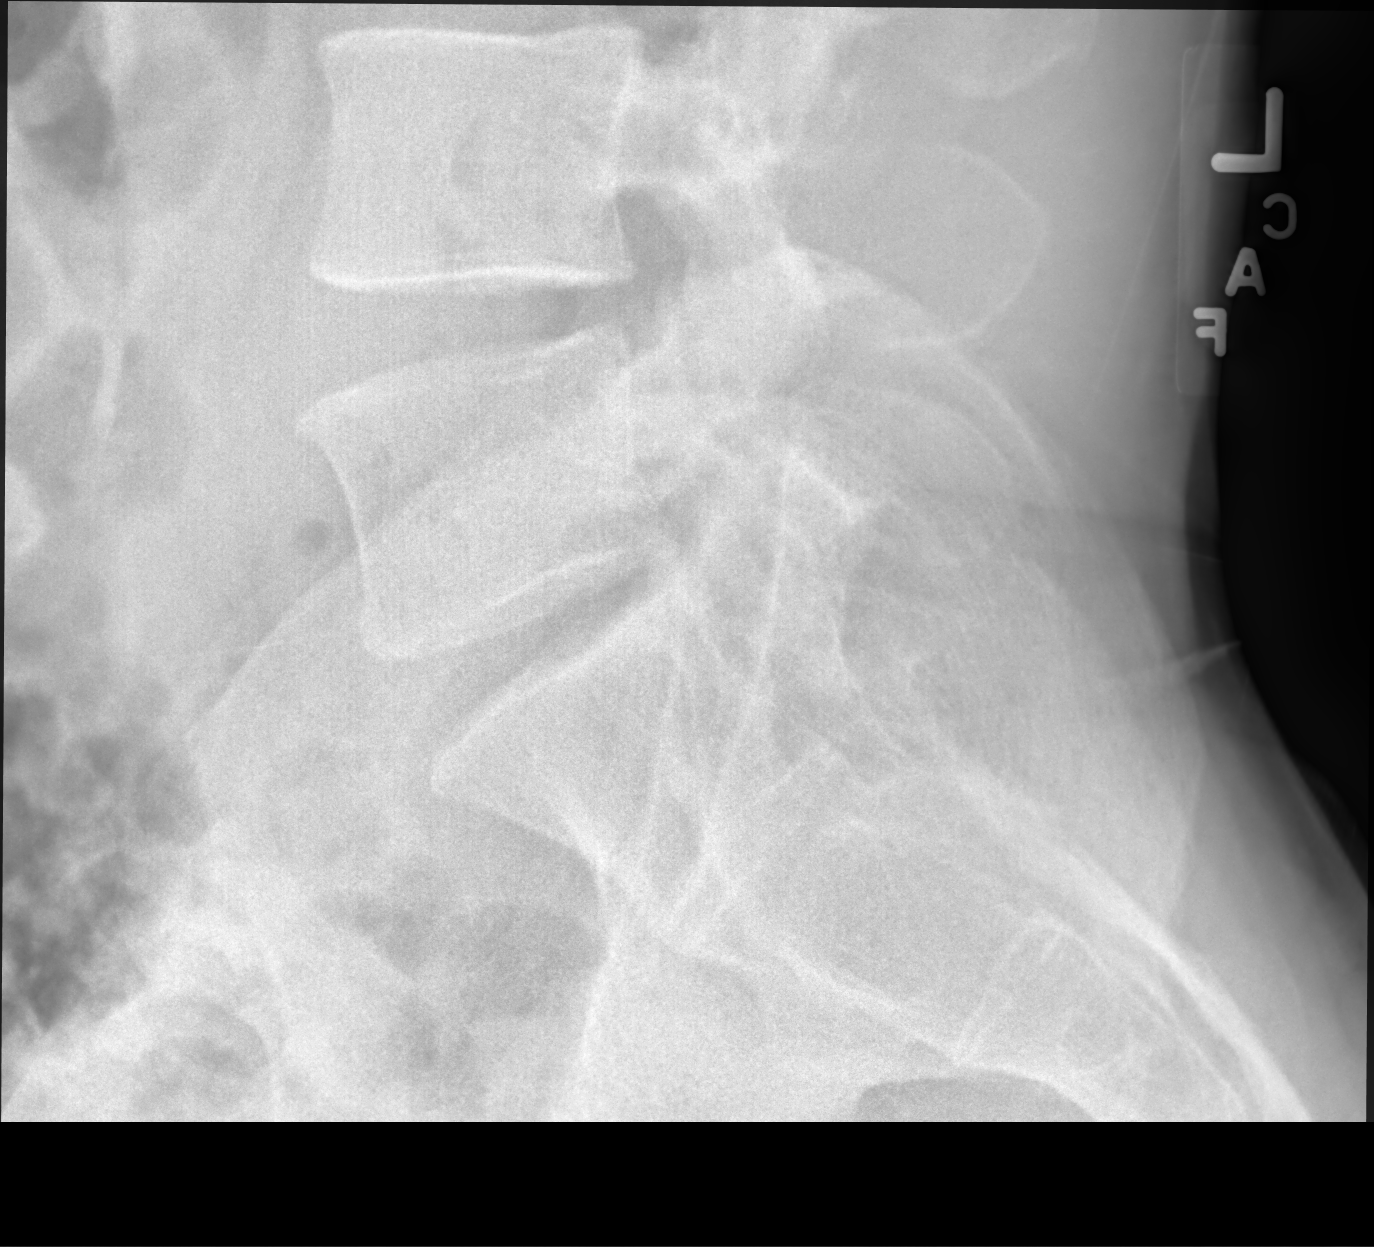

[3 of 3 positions shown; findings below may reference images not displayed]

FINDINGS: Frontal, lateral, and spot lumbosacral lateral images were obtained.
There are 5 non-rib-bearing lumbar type vertebral bodies. There is
no fracture or spondylolisthesis. There is slight disc space
narrowing at L1-2. Other disc spaces appear unremarkable. No erosive
change. There are small anterior osteophytes at L1, L2, and L3.
IMPRESSION: Mild disc space narrowing at L1-2. No fracture or spondylolisthesis.

## 2019-10-11 ENCOUNTER — Encounter: Payer: Self-pay | Admitting: Physician Assistant

## 2019-10-11 ENCOUNTER — Other Ambulatory Visit: Payer: Self-pay

## 2019-10-11 ENCOUNTER — Ambulatory Visit (INDEPENDENT_AMBULATORY_CARE_PROVIDER_SITE_OTHER): Payer: 59 | Admitting: Physician Assistant

## 2019-10-11 VITALS — BP 130/88 | HR 67 | Temp 98.0°F | Ht 71.0 in | Wt 192.4 lb

## 2019-10-11 DIAGNOSIS — F4310 Post-traumatic stress disorder, unspecified: Secondary | ICD-10-CM

## 2019-10-11 DIAGNOSIS — F419 Anxiety disorder, unspecified: Secondary | ICD-10-CM

## 2019-10-11 DIAGNOSIS — F322 Major depressive disorder, single episode, severe without psychotic features: Secondary | ICD-10-CM

## 2019-10-11 MED ORDER — FLUOXETINE HCL 10 MG PO CAPS: 10.0000 mg | ORAL_CAPSULE | Freq: Every day | ORAL | 1 refills | Status: DC

## 2019-10-11 NOTE — Progress Notes (Signed)
Tristan Davenport is a 39 y.o. male here for a new problem.  I acted as a Neurosurgeon for [employer], PA-C Corky Mull, LPN   History of Present Illness:   Chief Complaint  Patient presents with  . Anxiety  . Depression   HPI   Anxiety/Depression/PTSD Pt is here today to discuss going on a medication for his anxiety and depression per his therapist. He does have suicidal thoughts but no plan. He has been working closely with his therapist to uncover thoughts surrounding his time in the [employer]. He is seeing her weekly. He is slowly allowing his girlfriend in to discuss these issues as well.   Depression screen [facility] 2/9 10/11/2019 06/07/2019 05/18/2017 05/06/2016  Decreased Interest 2 1 0 0  Down, Depressed, Hopeless 2 2 0 2  PHQ - 2 Score 4 3 0 2  Altered sleeping 3 3 - 3  Tired, decreased energy 3 2 - 0  Change in appetite 0 0 - 0  Feeling bad or failure about yourself  3 2 - 0  Trouble concentrating 2 3 - 0  Moving slowly or fidgety/restless 1 0 - 0  Suicidal thoughts 2 0 - 0  PHQ-9 Score 18 13 - 5  Difficult doing work/chores Very difficult Somewhat difficult - Not difficult at all   GAD 7 : Generalized Anxiety Score 10/11/2019 06/07/2019  Nervous, Anxious, on Edge 3 3  Control/stop worrying 1 1  Worry too much - different things 1 1  Trouble relaxing 3 2  Restless 1 0  Easily annoyed or irritable 3 3  Afraid - awful might happen 0 2  Total GAD 7 Score 12 12  Anxiety Difficulty Very difficult Very difficult      Past Medical History:  Diagnosis Date  . Cluster headaches    last one was 5 year  . Eosinophilic esophagitis 2018   biopsies positive on 05/22/16; followed by GI     Social History   Tobacco Use  . Smoking status: Former Smoker    Types: Cigarettes    Quit date: 05/07/2007    Years since quitting: 12.4  . Smokeless tobacco: Former Neurosurgeon    Types: Snuff    Quit date: 05/06/2013  Substance Use Topics  . Alcohol use: Yes    Alcohol/week: 12.0 standard  drinks    Types: 12 Shots of liquor per week  . Drug use: No    History reviewed. No pertinent surgical history.  Family History  Problem Relation Age of Onset  . Stroke Mother 46  . Hypertension Father   . Other Father        Giant Cell Arteririts and Polymyalgia Rheumatica  . Anorexia nervosa Sister   . Parkinson's disease Maternal Grandfather   . Emphysema Paternal Grandfather   . Colon cancer Neg Hx   . Stomach cancer Neg Hx     Allergies  Allergen Reactions  . Penicillins     Current Medications:   Current Outpatient Medications:  .  lisinopril (ZESTRIL) 10 MG tablet, Take 1 tablet (10 mg total) by mouth daily., Disp: 90 tablet, Rfl: 1 .  FLUoxetine (PROZAC) 10 MG capsule, Take 1 capsule (10 mg total) by mouth daily., Disp: 30 capsule, Rfl: 1   Review of Systems:   ROS Negative unless otherwise specified per HPI.  Vitals:   Vitals:   10/11/19 0735  BP: 130/88  Pulse: 67  Temp: 98 F (36.7 C)  TempSrc: Temporal  SpO2: 97%  Weight: 192 lb  6.1 oz (87.3 kg)  Height: 5\' 11"  (1.803 m)     Body mass index is 26.83 kg/m.  Physical Exam:   Physical Exam Vitals and nursing note reviewed.  Constitutional:      General: He is not in acute distress.    Appearance: He is well-developed. He is not ill-appearing or toxic-appearing.  Cardiovascular:     Rate and Rhythm: Normal rate and regular rhythm.     Pulses: Normal pulses.     Heart sounds: Normal heart sounds, S1 normal and S2 normal.     Comments: No LE edema Pulmonary:     Effort: Pulmonary effort is normal.     Breath sounds: Normal breath sounds.  Skin:    General: Skin is warm and dry.  Neurological:     Mental Status: He is alert.     GCS: GCS eye subscore is 4. GCS verbal subscore is 5. GCS motor subscore is 6.  Psychiatric:        Speech: Speech normal.        Behavior: Behavior normal. Behavior is cooperative.      Assessment and Plan:   Daud was seen today for anxiety and  depression.  Diagnoses and all orders for this visit:  Depression, major, single episode, severe (HCC); Anxiety; PTSD (post-traumatic stress disorder) Uncontrolled. Currently without suicidal plan. Start 10 mg Prozac daily (he prefers lowest dose possible). Follow-up in 1 month. Side effects of medication reviewed including: worsening mood, suicidality, nausea, GI distress. Patient verbalized understanding of risks. I discussed with patient that if they develop any SI, to tell someone immediately and seek medical attention. Follow-up in 1 month, sooner if concerns.  Other orders -     FLUoxetine (PROZAC) 10 MG capsule; Take 1 capsule (10 mg total) by mouth daily.  . Reviewed expectations re: course of current medical issues. . Discussed self-management of symptoms. . Outlined signs and symptoms indicating need for more acute intervention. . Patient verbalized understanding and all questions were answered. . See orders for this visit as documented in the electronic medical record. . Patient received an After-Visit Summary.  CMA or LPN served as scribe during this visit. History, Physical, and Plan performed by medical provider. The above documentation has been reviewed and is accurate and complete.  Tinnie Gens, PA-C

## 2019-10-11 NOTE — Patient Instructions (Signed)
It was great to see you!  Start Prozac 10 mg daily. Please tell someone you trust that you are starting this medication, if you are feeling suicidal, please seek urgent help.  Let's follow-up in 1 month, sooner if you have concerns.  Take care,  Jarold Motto PA-C

## 2019-11-08 ENCOUNTER — Ambulatory Visit: Payer: 59 | Admitting: Physician Assistant

## 2019-11-08 ENCOUNTER — Other Ambulatory Visit: Payer: Self-pay

## 2019-11-08 ENCOUNTER — Encounter: Payer: Self-pay | Admitting: Physician Assistant

## 2019-11-08 VITALS — BP 142/94 | HR 63 | Temp 97.8°F | Ht 71.0 in | Wt 190.0 lb

## 2019-11-08 DIAGNOSIS — F4310 Post-traumatic stress disorder, unspecified: Secondary | ICD-10-CM | POA: Diagnosis not present

## 2019-11-08 DIAGNOSIS — F419 Anxiety disorder, unspecified: Secondary | ICD-10-CM | POA: Diagnosis not present

## 2019-11-08 DIAGNOSIS — F322 Major depressive disorder, single episode, severe without psychotic features: Secondary | ICD-10-CM | POA: Diagnosis not present

## 2019-11-08 MED ORDER — FLUOXETINE HCL 10 MG PO CAPS: 10.0000 mg | ORAL_CAPSULE | Freq: Every day | ORAL | 1 refills | Status: DC

## 2019-11-08 NOTE — Progress Notes (Signed)
Tristan Davenport is a 39 y.o. male is here to discuss: depression and anxiety  I acted as a Neurosurgeon for [employer], PA-C [place], [place]  History of Present Illness:   Chief Complaint  Patient presents with   Depression   Anxiety    HPI   Depression & Anxiety Pt following up on anxiety and depression symptoms. Pt states symptoms are improving. Pt is currently taking fluoxetine 10 mg daily.  He reports that he had some initial worsening of mood and GI side effects but all of that improved after about two weeks.  Depression screen [facility] 2/9 11/08/2019 10/11/2019 06/07/2019 05/18/2017 05/06/2016  Decreased Interest 2 2 1  0 0  Down, Depressed, Hopeless 1 2 2  0 2  PHQ - 2 Score 3 4 3  0 2  Altered sleeping 2 3 3  - 3  Tired, decreased energy 1 3 2  - 0  Change in appetite 0 0 0 - 0  Feeling bad or failure about yourself  2 3 2  - 0  Trouble concentrating 2 2 3  - 0  Moving slowly or fidgety/restless 0 1 0 - 0  Suicidal thoughts 1 2 0 - 0  PHQ-9 Score 11 18 13  - 5  Difficult doing work/chores Somewhat difficult Very difficult Somewhat difficult - Not difficult at all   GAD 7 : Generalized Anxiety Score 11/08/2019 10/11/2019 06/07/2019  Nervous, Anxious, on Edge 1 3 3   Control/stop worrying 1 1 1   Worry too much - different things 1 1 1   Trouble relaxing 2 3 2   Restless 2 1 0  Easily annoyed or irritable 2 3 3   Afraid - awful might happen 0 0 2  Total GAD 7 Score 9 12 12   Anxiety Difficulty Somewhat difficult Very difficult Very difficult      There are no preventive care reminders to display for this patient.  Past Medical History:  Diagnosis Date   Cluster headaches    last one was 5 year   Eosinophilic esophagitis 2018   biopsies positive on 05/22/16; followed by GI     Social History   Tobacco Use   Smoking status: Former Smoker    Types: Cigarettes    Quit date: 05/07/2007    Years since quitting: 12.5   Smokeless tobacco: Former    Types: Snuff     Quit date: 05/06/2013  Substance Use Topics   Alcohol use: Yes    Alcohol/week: 12.0 standard drinks    Types: 12 Shots of liquor per week   Drug use: No    History reviewed. No pertinent surgical history.  Family History  Problem Relation Age of Onset   Stroke Mother 6   Hypertension Father    Other Father        Giant Cell Arteririts and Polymyalgia Rheumatica   Anorexia nervosa Sister    Parkinson's disease Maternal Grandfather    Emphysema Paternal Grandfather    Colon cancer Neg Hx    Stomach cancer Neg Hx     PMHx, SurgHx, SocialHx, FamHx, Medications, and Allergies were reviewed in the Visit Navigator and updated as appropriate.   Patient Active Problem List   Diagnosis Date Noted   Hyperlipidemia 05/11/2017   HTN (hypertension) 06/02/2016   Testicular discomfort 06/02/2016   Eosinophilic esophagitis 05/28/2016   Onychomycosis 03/19/2015   Tinea pedis of both feet 02/18/2015    Social History   Tobacco Use   Smoking status: Former Smoker    Types: Cigarettes  Quit date: 05/07/2007    Years since quitting: 12.5   Smokeless tobacco: Former Neurosurgeon    Types: Snuff    Quit date: 05/06/2013  Substance Use Topics   Alcohol use: Yes    Alcohol/week: 12.0 standard drinks    Types: 12 Shots of liquor per week   Drug use: No    Current Medications and Allergies:    Current Outpatient Medications:    FLUoxetine (PROZAC) 10 MG capsule, Take 1 capsule (10 mg total) by mouth daily., Disp: 90 capsule, Rfl: 1   lisinopril (ZESTRIL) 10 MG tablet, Take 1 tablet (10 mg total) by mouth daily., Disp: 90 tablet, Rfl: 1   Allergies  Allergen Reactions   Penicillins     Review of Systems   ROS Negative unless otherwise specified per HPI.  Vitals:   Vitals:   11/08/19 0739  BP: (!) 142/94  Pulse: 63  Temp: 97.8 F (36.6 C)  TempSrc: Temporal  SpO2: 96%  Weight: 190 lb (86.2 kg)  Height: 5\' 11"  (1.803 m)     Body mass index is 26.5  kg/m.   Physical Exam:    Physical Exam Vitals and nursing note reviewed.  Constitutional:      General: He is not in acute distress.    Appearance: He is well-developed. He is not ill-appearing or toxic-appearing.  Cardiovascular:     Rate and Rhythm: Normal rate and regular rhythm.     Pulses: Normal pulses.     Heart sounds: Normal heart sounds, S1 normal and S2 normal.     Comments: No LE edema Pulmonary:     Effort: Pulmonary effort is normal.     Breath sounds: Normal breath sounds.  Skin:    General: Skin is warm and dry.  Neurological:     Mental Status: He is alert.     GCS: GCS eye subscore is 4. GCS verbal subscore is 5. GCS motor subscore is 6.  Psychiatric:        Speech: Speech normal.        Behavior: Behavior normal. Behavior is cooperative.      Assessment and Plan:    [name] was seen today for depression and anxiety.  Diagnoses and all orders for this visit:  Depression, major, single episode, severe (HCC)  Anxiety  PTSD (post-traumatic stress disorder)  Other orders -     FLUoxetine (PROZAC) 10 MG capsule; Take 1 capsule (10 mg total) by mouth daily.   Improved. Denies active SI/HI today.  Continue talk therapy. Would like to continue this dose for 6 months, I have refilled. Follow-up in 6 months, sooner if concerns.   Reviewed expectations re: course of current medical issues.  Discussed self-management of symptoms.  Outlined signs and symptoms indicating need for more acute intervention.  Patient verbalized understanding and all questions were answered.  See orders for this visit as documented in the electronic medical record.  Patient received an After Visit Summary.  CMA or LPN served as scribe during this visit. History, Physical, and Plan performed by medical provider. The above documentation has been reviewed and is accurate and complete.   Tinnie Gens, PA-C [place], [place] 11/08/2019  Follow-up: No  follow-ups on file.

## 2020-03-08 ENCOUNTER — Encounter: Payer: Self-pay | Admitting: Physician Assistant

## 2020-03-09 ENCOUNTER — Other Ambulatory Visit: Payer: Self-pay | Admitting: Physician Assistant

## 2020-03-13 ENCOUNTER — Other Ambulatory Visit: Payer: Self-pay | Admitting: Physician Assistant

## 2020-03-13 DIAGNOSIS — M79644 Pain in right finger(s): Secondary | ICD-10-CM

## 2020-03-15 ENCOUNTER — Other Ambulatory Visit: Payer: Self-pay

## 2020-03-15 ENCOUNTER — Ambulatory Visit (INDEPENDENT_AMBULATORY_CARE_PROVIDER_SITE_OTHER)
Admission: RE | Admit: 2020-03-15 | Discharge: 2020-03-15 | Disposition: A | Discharge status: Home / Self Care | Payer: 59 | Source: Ambulatory Visit | Attending: Physician Assistant | Admitting: Physician Assistant

## 2020-03-15 DIAGNOSIS — M79644 Pain in right finger(s): Secondary | ICD-10-CM | POA: Diagnosis not present

## 2020-04-03 ENCOUNTER — Encounter: Payer: Self-pay | Admitting: Physician Assistant

## 2020-05-15 ENCOUNTER — Encounter: Payer: Self-pay | Admitting: Physician Assistant

## 2020-05-15 ENCOUNTER — Other Ambulatory Visit: Payer: Self-pay

## 2020-05-15 ENCOUNTER — Ambulatory Visit: Payer: 59 | Admitting: Physician Assistant

## 2020-05-15 VITALS — BP 136/90 | HR 78 | Temp 97.9°F | Ht 71.0 in | Wt 203.2 lb

## 2020-05-15 DIAGNOSIS — F419 Anxiety disorder, unspecified: Secondary | ICD-10-CM | POA: Diagnosis not present

## 2020-05-15 DIAGNOSIS — F322 Major depressive disorder, single episode, severe without psychotic features: Secondary | ICD-10-CM

## 2020-05-15 DIAGNOSIS — U071 COVID-19: Secondary | ICD-10-CM | POA: Diagnosis not present

## 2020-05-15 DIAGNOSIS — M545 Low back pain, unspecified: Secondary | ICD-10-CM

## 2020-05-15 MED ORDER — FLUOXETINE HCL 10 MG PO CAPS: 10.0000 mg | ORAL_CAPSULE | Freq: Every day | ORAL | 3 refills | Status: AC

## 2020-05-15 NOTE — Progress Notes (Signed)
Tristan Davenport is a 40 y.o. male is here to discuss:  I acted as a Neurosurgeon for [employer], PA-C Corky Mull, LPN  History of Present Illness:   Chief Complaint  Patient presents with  . Anxiety  . Depression    HPI    COVID-19 Had COVID-19 in January. He is still having fatigue and trouble being in the moment. He is active and still has some elevated resting heart rate. Denies ongoing SOB, chest pain, chest tightness. Does not feel like he needs an inhaler or other medication at this time.  Wt Readings from Last 4 Encounters:  05/15/20 203 lb 4 oz (92.2 kg)  11/08/19 190 lb (86.2 kg)  10/11/19 192 lb 6.1 oz (87.3 kg)  06/07/19 202 lb 6.1 oz (91.8 kg)    Anxiety / Depression Pt is here for follow up, currently taking Prozac 10 mg daily. Pt feels it is helping. Denies SI/HI. Had a rough January due to COVID but things are slowly improving.   Depression screen [facility] 2/9 05/15/2020 11/08/2019 10/11/2019 06/07/2019 05/18/2017  Decreased Interest 2 2 2 1  0  Down, Depressed, Hopeless 3 1 2 2  0  PHQ - 2 Score 5 3 4 3  0  Altered sleeping 3 2 3 3  -  Tired, decreased energy 3 1 3 2  -  Change in appetite 1 0 0 0 -  Feeling bad or failure about yourself  3 2 3 2  -  Trouble concentrating 1 2 2 3  -  Moving slowly or fidgety/restless 0 0 1 0 -  Suicidal thoughts 1 1 2  0 -  PHQ-9 Score 17 11 18 13  -  Difficult doing work/chores Somewhat difficult Somewhat difficult Very difficult Somewhat difficult -    Back pain Recent issue. Feels like its a muscle strain. Started while being at home during COVID-19. Denies: hematuria, fever, chills, difficulty urinating, nausea, vomiting.   Health Maintenance Due  Topic Date Due  . COVID-19 Vaccine (3 - Booster) 05/16/2020    Past Medical History:  Diagnosis Date  . Cluster headaches    last one was 5 year  . Eosinophilic esophagitis 2018   biopsies positive on 05/22/16; followed by GI     Social History   Tobacco Use  . Smoking  status: Former Smoker    Types: Cigarettes    Quit date: 05/07/2007    Years since quitting: 13.0  . Smokeless tobacco: Former    Types: Snuff    Quit date: 05/06/2013  Substance Use Topics  . Alcohol use: Yes    Alcohol/week: 12.0 standard drinks    Types: 12 Shots of liquor per week  . Drug use: No    History reviewed. No pertinent surgical history.  Family History  Problem Relation Age of Onset  . Stroke Mother 80  . Hypertension Father   . Other Father        Giant Cell Arteririts and Polymyalgia Rheumatica  . Anorexia nervosa Sister   . Parkinson's disease Maternal Grandfather   . Emphysema Paternal Grandfather   . Colon cancer Neg Hx   . Stomach cancer Neg Hx     PMHx, SurgHx, SocialHx, FamHx, Medications, and Allergies were reviewed in the Visit Navigator and updated as appropriate.   Patient Active Problem List   Diagnosis Date Noted  . Hyperlipidemia 05/11/2017  . HTN (hypertension) 06/02/2016  . Testicular discomfort 06/02/2016  . Eosinophilic esophagitis 05/28/2016  . Onychomycosis 03/19/2015  . Tinea pedis of both feet 02/18/2015  Social History   Tobacco Use  . Smoking status: Former Smoker    Types: Cigarettes    Quit date: 05/07/2007    Years since quitting: 13.0  . Smokeless tobacco: Former Neurosurgeon    Types: Snuff    Quit date: 05/06/2013  Substance Use Topics  . Alcohol use: Yes    Alcohol/week: 12.0 standard drinks    Types: 12 Shots of liquor per week  . Drug use: No    Current Medications and Allergies:    Current Outpatient Medications:  .  lisinopril (ZESTRIL) 10 MG tablet, Take 1 tablet (10 mg total) by mouth daily., Disp: 90 tablet, Rfl: 1 .  FLUoxetine (PROZAC) 10 MG capsule, Take 1 capsule (10 mg total) by mouth daily., Disp: 90 capsule, Rfl: 3   Allergies  Allergen Reactions  . Penicillins     Review of Systems   ROS  Negative unless otherwise specified per HPI.  Vitals:   Vitals:   05/15/20 0740  BP: 136/90   Pulse: 78  Temp: 97.9 F (36.6 C)  TempSrc: Temporal  SpO2: 96%  Weight: 203 lb 4 oz (92.2 kg)  Height: 5\' 11"  (1.803 m)     Body mass index is 28.35 kg/m.   Physical Exam:    Physical Exam Vitals and nursing note reviewed.  Constitutional:      General: He is not in acute distress.    Appearance: He is well-developed. He is not ill-appearing, toxic-appearing or sickly-appearing.  Cardiovascular:     Rate and Rhythm: Normal rate and regular rhythm.     Pulses: Normal pulses.     Heart sounds: Normal heart sounds, S1 normal and S2 normal.     Comments: No LE edema Pulmonary:     Effort: Pulmonary effort is normal.     Breath sounds: Normal breath sounds.  Skin:    General: Skin is warm, dry and intact.  Neurological:     Mental Status: He is alert.     GCS: GCS eye subscore is 4. GCS verbal subscore is 5. GCS motor subscore is 6.  Psychiatric:        Mood and Affect: Mood and affect normal.        Speech: Speech normal.        Behavior: Behavior normal. Behavior is cooperative.      Assessment and Plan:    Brandol was seen today for anxiety and depression.  Diagnoses and all orders for this visit:  COVID-19 Doing well overall since his course. Denies any further needs.  Depression, major, single episode, severe (HCC); Anxiety Denies suicidal plan. Continue prozac 10 mg daily, he does not want to make an changes today. Follow-up in 1 year, sooner if concerns. Continue therapy.  Acute bilateral low back pain without sciatica Denies acute treatment, but would like possible dry needling with electrical stimulation. I'm looking into this for patient and will follow-up via mychart regarding this.  Other orders -     FLUoxetine (PROZAC) 10 MG capsule; Take 1 capsule (10 mg total) by mouth daily.   CMA or LPN served as scribe during this visit. History, Physical, and Plan performed by medical provider. The above documentation has been reviewed and is accurate  and complete.  Tinnie Gens, PA-C [place], [place] 05/15/2020  Follow-up: No follow-ups on file.

## 2020-05-16 ENCOUNTER — Other Ambulatory Visit: Payer: Self-pay | Admitting: Physician Assistant

## 2020-05-16 DIAGNOSIS — M545 Low back pain, unspecified: Secondary | ICD-10-CM

## 2020-05-16 DIAGNOSIS — G8929 Other chronic pain: Secondary | ICD-10-CM

## 2020-05-23 ENCOUNTER — Other Ambulatory Visit: Payer: Self-pay

## 2020-05-23 ENCOUNTER — Encounter: Payer: Self-pay | Admitting: Physical Therapy

## 2020-05-23 ENCOUNTER — Ambulatory Visit (INDEPENDENT_AMBULATORY_CARE_PROVIDER_SITE_OTHER): Payer: 59 | Admitting: Physical Therapy

## 2020-05-23 DIAGNOSIS — R29898 Other symptoms and signs involving the musculoskeletal system: Secondary | ICD-10-CM

## 2020-05-23 DIAGNOSIS — M545 Low back pain, unspecified: Secondary | ICD-10-CM | POA: Diagnosis not present

## 2020-05-23 NOTE — Therapy (Signed)
[facility] Physical Therapy [address] [place], [place], [address] Phone: [phone]   Fax:  [phone]  Physical Therapy Evaluation  Patient Details  Name: Tristan Davenport MRN: [id number] Date of Birth: 10-Aug-1980 Referring Provider (PT): Jarold Motto, [name]   Encounter Date: 05/23/2020   PT End of Session - 05/23/20 1442    Visit Number 1    Number of Visits 6    Date for PT Re-Evaluation 07/04/20    Authorization Type U[employer] $25 copay    PT Start Time 1348    PT Stop Time 1432    PT Time Calculation (min) 44 min    Activity Tolerance Patient tolerated treatment well    Behavior During Therapy [facility] for tasks assessed/performed           Past Medical History:  Diagnosis Date  . Cluster headaches    last one was 5 year  . Eosinophilic esophagitis 2018   biopsies positive on 05/22/16; followed by GI    History reviewed. No pertinent surgical history.  There were no vitals filed for this visit.    Subjective Assessment - 05/23/20 1351    Subjective Pt is a 40 y/o male who presents to OPPT for acute on chronic LBP x 2-3 months without known specific injury.  LBP has been present for nearly 10 years and had DN with estim at that time which was very helpful.    Pertinent History cluster headaches, eosinophilic esophagties, HTN    Limitations Standing;Lifting    Patient Stated Goals improve pain    Currently in Pain? Yes    Pain Score 5    up to 10/10; at best 0/10   Pain Location Back    Pain Orientation Lower;Mid;Right;Left    Pain Descriptors / Indicators Spasm;Shooting;Tightness    Pain Type Acute pain;Chronic pain    Pain Onset More than a month ago    Pain Frequency Intermittent    Aggravating Factors  unsure; extended time carrying work equipment    Pain Relieving Factors DN with estim, chiropractor              [facility] PT Assessment - 05/23/20 1357      Assessment   Medical Diagnosis M54.50,G89.29 (ICD-10-CM) - Chronic bilateral  low back pain without sciatica    Referring Provider (PT) Jarold Motto, PA    Onset Date/Surgical Date --   acute exacerbation (2-3 months) on chronic condition   Hand Dominance Right    Next MD Visit PRN    Prior Therapy in [place] 9 years ago      Precautions   Precautions Fall      Restrictions   Weight Bearing Restrictions No      Balance Screen   Has the patient fallen in the past 6 months No    Has the patient had a decrease in activity level because of a fear of falling?  No    Is the patient reluctant to leave their home because of a fear of falling?  No      Home Environment   Living Environment Private residence    Living Arrangements Spouse/significant other    Type of Home House    Home Access Stairs to enter    Entrance Stairs-Number of Steps 5    Entrance Stairs-Rails None    Home Layout Two level;Bed/bath upstairs    Alternate Level Stairs-Number of Steps 13    Alternate Level Stairs-Rails Right    Additional Comments when pain elevated -  unable to negotiate stairs      Prior Function   Level of Independence Independent    Vocation Full time employment    English as a second language teacher; teaches stand up paddleboarding    Leisure paddleboarding      Cognition   Overall Cognitive Status Within Functional Limits for tasks assessed      ROM / Strength   AROM / PROM / Strength AROM      AROM   AROM Assessment Site Lumbar    Lumbar Flexion WNL    Lumbar Extension WNL    Lumbar - Right Side Bend WNL    Lumbar - Left Side Bend WNL    Lumbar - Right Rotation WNL    Lumbar - Left Rotation WNL      Palpation   Spinal mobility hypomobility CPA grades 1-2 T 8-L4    Palpation comment tightness bil QL      Special Tests    Special Tests Lumbar    Lumbar Tests Slump Test      Slump test   Findings Negative                      Objective measurements completed on examination: See above findings.       [facility] Adult PT  Treatment/Exercise - 05/23/20 0001      Manual Therapy   Manual Therapy Soft tissue mobilization    Manual therapy comments skilled palpation and monitoring of soft tissue during DN    Soft tissue mobilization lumbar and thoracic paraspinals with Grades 2-3 CPA mobs T 7-9, L 3-5            Trigger Point Dry Needling - 05/23/20 1439    Consent Given? Yes    Education Handout Provided Yes    Muscles Treated Back/Hip Lumbar multifidi;Thoracic multifidi    Electrical Stimulation Performed with Dry Needling Yes    E-stim with Dry Needling Details to thoracic and lumbar multifidi to tolerance x 5 min each    Lumbar multifidi Response Twitch response elicited    Thoracic multifidi response Twitch response elicited                PT Education - 05/23/20 1441    Education Details DN    Person(s) Educated Patient    Methods Explanation;Other (comment)   electronic copy   Comprehension Verbalized understanding            PT Short Term Goals - 05/23/20 1443      PT SHORT TERM GOAL #1   Title report pain < 5/10 in back for improved mobility and decreased intensity in spasms    Status New    Target Date 06/13/20             PT Long Term Goals - 05/23/20 1444      PT LONG TERM GOAL #1   Title independent with community fitness program to include strengthening/flexibility/endurance    Status New    Target Date 07/04/20      PT LONG TERM GOAL #2   Title report decreased frequency and intensity of spasms by at least 50% for improved mobility    Status New    Target Date 07/04/20      PT LONG TERM GOAL #3   Title report pain < 4/10 for improved function    Status New    Target Date 07/04/20  Plan - 05/23/20 1445    Clinical Impression Statement Pt is a 40 y/o male who presents to OPPT for acute on chronic mid and low back pain.  Pt demonstrates hypomobility of thoracic and lumbar spine and reports in the past DN with estim was successful so  repeated again today.  Pt will benefit from PT to address deficits listed.    Personal Factors and Comorbidities Comorbidity 2;Past/Current Experience;Time since onset of injury/illness/exacerbation    Comorbidities eosinophilic esophagties, HTN    Examination-Activity Limitations Lift;Carry;Reach Overhead;Stairs;Locomotion Level;Transfers    Examination-Participation Restrictions Yard Work;Occupation    Stability/Clinical Decision Making Evolving/Moderate complexity    Clinical Decision Making Moderate    Rehab Potential Good    PT Frequency 1x / week    PT Duration 6 weeks    PT Treatment/Interventions ADLs/Self Care Home Management;Cryotherapy;Electrical Stimulation;Functional mobility training;Therapeutic activities;Therapeutic exercise;Patient/family education;Neuromuscular re-education;Manual techniques;Taping;Dry needling;Passive range of motion    PT Next Visit Plan establish HEP, continue with DN/estim PRN    PT Home Exercise Plan next visit    Consulted and Agree with Plan of Care Patient           Patient will benefit from skilled therapeutic intervention in order to improve the following deficits and impairments:  Increased fascial restricitons,Pain,Postural dysfunction,Increased muscle spasms,Impaired tone,Decreased range of motion,Decreased strength  Visit Diagnosis: Acute midline low back pain without sciatica - Plan: PT plan of care cert/re-cert  Other symptoms and signs involving the musculoskeletal system - Plan: PT plan of care cert/re-cert     Problem List Patient Active Problem List   Diagnosis Date Noted  . Hyperlipidemia 05/11/2017  . HTN (hypertension) 06/02/2016  . Testicular discomfort 06/02/2016  . Eosinophilic esophagitis 05/28/2016  . Onychomycosis 03/19/2015  . Tinea pedis of both feet 02/18/2015       Clarita Crane, PT, DPT 05/23/20 2:52 PM     [facility] Physical Therapy [address] [place], [place],  [address] Phone: [phone]   Fax:  [phone]  Name: Vicente Weidler MRN: [id number] Date of Birth: 1980-08-11

## 2020-05-23 NOTE — Patient Instructions (Signed)

## 2020-05-30 ENCOUNTER — Ambulatory Visit: Payer: 59 | Admitting: Physical Therapy

## 2020-05-30 ENCOUNTER — Encounter: Payer: Self-pay | Admitting: Physical Therapy

## 2020-05-30 ENCOUNTER — Other Ambulatory Visit: Payer: Self-pay

## 2020-05-30 DIAGNOSIS — R29898 Other symptoms and signs involving the musculoskeletal system: Secondary | ICD-10-CM

## 2020-05-30 DIAGNOSIS — M545 Low back pain, unspecified: Secondary | ICD-10-CM | POA: Diagnosis not present

## 2020-05-30 NOTE — Therapy (Signed)
[facility] Physical Therapy [address] [place], [place], [address] Phone: [phone]   Fax:  [phone]  Physical Therapy Treatment  Patient Details  Name: Tristan Davenport MRN: [id number] Date of Birth: 1980/08/10 Referring Provider (PT): Jarold Motto, [name]   Encounter Date: 05/30/2020   PT End of Session - 05/30/20 1138    Visit Number 2    Number of Visits 6    Date for PT Re-Evaluation 07/04/20    Authorization Type UHC $25 copay    PT Start Time 1058    PT Stop Time 1136    PT Time Calculation (min) 38 min    Activity Tolerance Patient tolerated treatment well    Behavior During Therapy [facility] for tasks assessed/performed           Past Medical History:  Diagnosis Date  . Cluster headaches    last one was 5 year  . Eosinophilic esophagitis 2018   biopsies positive on 05/22/16; followed by GI    History reviewed. No pertinent surgical history.  There were no vitals filed for this visit.   Subjective Assessment - 05/30/20 1102    Subjective "it was a miracle cure." feels better in upper back and low back, then had episode of sacral pain when at the gym lifting.    Pertinent History cluster headaches, eosinophilic esophagties, HTN    Limitations Standing;Lifting    Patient Stated Goals improve pain    Currently in Pain? Yes    Pain Score --   did not rate   Pain Location Sacrum    Pain Orientation Right;Left    Pain Descriptors / Indicators Aching;Spasm    Pain Type Acute pain    Pain Onset --    Pain Frequency Intermittent    Aggravating Factors  gym workout    Pain Relieving Factors rest, stretching                             [facility] Adult PT Treatment/Exercise - 05/30/20 1136      Self-Care   Self-Care Other Self-Care Comments    Other Self-Care Comments  discussed ways to stretch glute med-pt to try folded pigeon pose at home      Manual Therapy   Manual Therapy Soft tissue mobilization    Manual therapy  comments skilled palpation and monitoring of soft tissue during DN    Soft tissue mobilization lumbar paraspinals, QL and glute med bil with compression            Trigger Point Dry Needling - 05/30/20 1137    Consent Given? Yes    Education Handout Provided Previously provided    Muscles Treated Back/Hip Quadratus lumborum;Gluteus medius    Electrical Stimulation Performed with Dry Needling Yes    E-stim with Dry Needling Details bil glute med to tolerance x 5 min each side    Gluteus Medius Response Twitch response elicited    Quadratus Lumborum Response Twitch response elicited                  PT Short Term Goals - 05/30/20 1138      PT SHORT TERM GOAL #1   Title report pain < 5/10 in back for improved mobility and decreased intensity in spasms    Status On-going    Target Date 06/13/20             PT Long Term Goals - 05/23/20 1444  PT LONG TERM GOAL #1   Title independent with community fitness program to include strengthening/flexibility/endurance    Status New    Target Date 07/04/20      PT LONG TERM GOAL #2   Title report decreased frequency and intensity of spasms by at least 50% for improved mobility    Status New    Target Date 07/04/20      PT LONG TERM GOAL #3   Title report pain < 4/10 for improved function    Status New    Target Date 07/04/20                 Plan - 05/30/20 1139    Clinical Impression Statement Pt reports upper back pain nearly resolved after last session and low back pain improved.  Had episode of pain/spasm near sacrum when doing squats at the gym with active trigger points noted in glute med and QL bil.  Treated with manual/DN with estim with positive response today.  All goals ongoing at this time.    Personal Factors and Comorbidities Comorbidity 2;Past/Current Experience;Time since onset of injury/illness/exacerbation    Comorbidities eosinophilic esophagties, HTN    Examination-Activity Limitations  Lift;Carry;Reach Overhead;Stairs;Locomotion Level;Transfers    Examination-Participation Restrictions Yard Work;Occupation    Stability/Clinical Decision Making Evolving/Moderate complexity    Rehab Potential Good    PT Frequency 1x / week    PT Duration 6 weeks    PT Treatment/Interventions ADLs/Self Care Home Management;Cryotherapy;Electrical Stimulation;Functional mobility training;Therapeutic activities;Therapeutic exercise;Patient/family education;Neuromuscular re-education;Manual techniques;Taping;Dry needling;Passive range of motion    PT Next Visit Plan establish HEP, continue with DN/estim PRN; assess response to session - how is glute med stretch going    PT Home Exercise Plan pigeon foleded pose    Consulted and Agree with Plan of Care Patient           Patient will benefit from skilled therapeutic intervention in order to improve the following deficits and impairments:  Increased fascial restricitons,Pain,Postural dysfunction,Increased muscle spasms,Impaired tone,Decreased range of motion,Decreased strength  Visit Diagnosis: Acute midline low back pain without sciatica  Other symptoms and signs involving the musculoskeletal system     Problem List Patient Active Problem List   Diagnosis Date Noted  . Hyperlipidemia 05/11/2017  . HTN (hypertension) 06/02/2016  . Testicular discomfort 06/02/2016  . Eosinophilic esophagitis 05/28/2016  . Onychomycosis 03/19/2015  . Tinea pedis of both feet 02/18/2015      Clarita Crane, PT, DPT 05/30/20 11:42 AM     [facility] Physical Therapy [address] [place], [place], [address] Phone: [phone]   Fax:  [phone]  Name: Tristan Davenport MRN: [id number] Date of Birth: 18-Apr-1980

## 2020-06-07 ENCOUNTER — Encounter: Payer: Self-pay | Admitting: Physical Therapy

## 2020-06-07 ENCOUNTER — Other Ambulatory Visit: Payer: Self-pay

## 2020-06-07 ENCOUNTER — Ambulatory Visit: Payer: 59 | Admitting: Physical Therapy

## 2020-06-07 DIAGNOSIS — R29898 Other symptoms and signs involving the musculoskeletal system: Secondary | ICD-10-CM | POA: Diagnosis not present

## 2020-06-07 DIAGNOSIS — M545 Low back pain, unspecified: Secondary | ICD-10-CM | POA: Diagnosis not present

## 2020-06-07 NOTE — Therapy (Signed)
[facility] Physical Therapy [address] [place], [place], [address] Phone: [phone]   Fax:  [phone]  Physical Therapy Evaluation  Patient Details  Name: Tristan Davenport MRN: [id number] Date of Birth: April 07, 1980 Referring Provider (PT): Inda Coke, [place]   Encounter Date: 06/07/2020   PT End of Session - 06/07/20 0924    Visit Number 3    Number of Visits 6    Date for PT Re-Evaluation 07/04/20    Authorization Type UHC $25 copay    PT Start Time 0845    PT Stop Time 0914    PT Time Calculation (min) 29 min    Activity Tolerance Patient tolerated treatment well    Behavior During Therapy [facility] for tasks assessed/performed           Past Medical History:  Diagnosis Date  . Cluster headaches    last one was 5 year  . Eosinophilic esophagitis 8185   biopsies positive on 05/22/16; followed by GI    History reviewed. No pertinent surgical history.  There were no vitals filed for this visit.    Subjective Assessment - 06/07/20 0846    Subjective feeling much better - went to chiropractor and had some mild flu like symptoms but better now.    Pertinent History cluster headaches, eosinophilic esophagties, HTN    Limitations Standing;Lifting    Patient Stated Goals improve pain    Currently in Pain? Yes    Pain Score 3     Pain Location Back    Pain Orientation Lower    Pain Descriptors / Indicators Aching;Spasm    Pain Type Acute pain    Pain Onset More than a month ago    Pain Frequency Intermittent    Aggravating Factors  gym workout    Pain Relieving Factors rest, stretching                          Objective measurements completed on examination: See above findings.         Trigger Point Dry Needling - 06/07/20 0851    Consent Given? Yes    Education Handout Provided Previously provided    Muscles Treated Back/Hip Quadratus lumborum;Lumbar multifidi    Electrical Stimulation Performed with Dry Needling Yes     E-stim with Dry Needling Details to lumbar multifidi L4-5 to tolerance    Lumbar multifidi Response Twitch response elicited    Quadratus Lumborum Response Twitch response elicited                  PT Short Term Goals - 06/07/20 0924      PT SHORT TERM GOAL #1   Title report pain < 5/10 in back for improved mobility and decreased intensity in spasms    Status Achieved    Target Date 06/13/20             PT Long Term Goals - 06/07/20 0924      PT LONG TERM GOAL #1   Title independent with community fitness program to include strengthening/flexibility/endurance    Status On-going    Target Date 07/04/20      PT LONG TERM GOAL #2   Title report decreased frequency and intensity of spasms by at least 50% for improved mobility    Status On-going    Target Date 07/04/20      PT LONG TERM GOAL #3   Title report pain < 4/10 for improved function    Status  Achieved                  Plan - 06/07/20 0925    Clinical Impression Statement Pt has met STG and LTG #3 at this time.  Overall doing well with limited pain and frequency.  Anticipate d/c next visit.    Personal Factors and Comorbidities Comorbidity 2;Past/Current Experience;Time since onset of injury/illness/exacerbation    Comorbidities eosinophilic esophagties, HTN    Examination-Activity Limitations Lift;Carry;Reach Overhead;Stairs;Locomotion Level;Transfers    Examination-Participation Restrictions Yard Work;Occupation    Stability/Clinical Decision Making Evolving/Moderate complexity    Rehab Potential Good    PT Frequency 1x / week    PT Duration 6 weeks    PT Treatment/Interventions ADLs/Self Care Home Management;Cryotherapy;Electrical Stimulation;Functional mobility training;Therapeutic activities;Therapeutic exercise;Patient/family education;Neuromuscular re-education;Manual techniques;Taping;Dry needling;Passive range of motion    PT Next Visit Plan establish HEP - pt has home community exercise  program, continue with DN/estim PRN; likely d/c next visit    PT Home Exercise Plan pigeon foleded pose    Consulted and Agree with Plan of Care Patient           Patient will benefit from skilled therapeutic intervention in order to improve the following deficits and impairments:  Increased fascial restricitons,Pain,Postural dysfunction,Increased muscle spasms,Impaired tone,Decreased range of motion,Decreased strength  Visit Diagnosis: Acute midline low back pain without sciatica  Other symptoms and signs involving the musculoskeletal system     Problem List Patient Active Problem List   Diagnosis Date Noted  . Hyperlipidemia 05/11/2017  . HTN (hypertension) 06/02/2016  . Testicular discomfort 06/02/2016  . Eosinophilic esophagitis 21/01/7355  . Onychomycosis 03/19/2015  . Tinea pedis of both feet 02/18/2015      Laureen Abrahams, PT, DPT 06/07/20 9:26 AM     [facility] Physical Therapy [address] [place], [place], [address] Phone: [phone]   Fax:  [phone]  Name: Tristan Davenport MRN: [id number] Date of Birth: [birth year]

## 2020-06-13 ENCOUNTER — Encounter: Payer: Self-pay | Admitting: Physical Therapy

## 2020-06-20 ENCOUNTER — Ambulatory Visit: Payer: 59 | Admitting: Physical Therapy

## 2020-06-20 ENCOUNTER — Other Ambulatory Visit: Payer: Self-pay

## 2020-06-20 ENCOUNTER — Encounter: Payer: Self-pay | Admitting: Physical Therapy

## 2020-06-20 DIAGNOSIS — R29898 Other symptoms and signs involving the musculoskeletal system: Secondary | ICD-10-CM

## 2020-06-20 DIAGNOSIS — M545 Low back pain, unspecified: Secondary | ICD-10-CM | POA: Diagnosis not present

## 2020-06-20 NOTE — Therapy (Addendum)
[facility] Physical Therapy [address] [place], [place], [address] Phone: [phone]   Fax:  [phone]  Physical Therapy Treatment/Discharge Summary  Patient Details  Name: Ko Bardon MRN: [id number] Date of Birth: 05-03-1980 Referring Provider (PT): Inda Coke, [place]   Encounter Date: 06/20/2020   PT End of Session - 06/20/20 0926    Visit Number 4    Number of Visits 6    Date for PT Re-Evaluation 07/04/20    Authorization Type UHC $25 copay    PT Start Time 0852    PT Stop Time 0918    PT Time Calculation (min) 26 min    Activity Tolerance Patient tolerated treatment well    Behavior During Therapy [facility] for tasks assessed/performed           Past Medical History:  Diagnosis Date   Cluster headaches    last one was 5 year   Eosinophilic esophagitis 2641   biopsies positive on 05/22/16; followed by GI    History reviewed. No pertinent surgical history.  There were no vitals filed for this visit.   Subjective Assessment - 06/20/20 0854    Subjective had a flare up with work in the low back; overall feels 85% better    Pertinent History cluster headaches, eosinophilic esophagties, HTN    Limitations Standing;Lifting    Patient Stated Goals improve pain    Currently in Pain? 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 Yes    Education Handout Provided Previously provided    Muscles Treated Back/Hip Gluteus medius;Lumbar multifidi    Electrical Stimulation Performed with Dry Needling Yes    E-stim with Dry Needling Details to lumbar multifidi L4-5 to tolerance then glute med to tolerance    Gluteus Medius Response Twitch response elicited    Lumbar multifidi Response Twitch response elicited                  PT Short Term Goals - 06/07/20 0924      PT SHORT TERM GOAL #1   Title report pain < 5/10 in back for improved mobility and decreased intensity in spasms    Status Achieved    Target Date 06/13/20             PT Long Term Goals - 06/20/20 0926      PT LONG TERM GOAL #1   Title independent with community fitness program to include strengthening/flexibility/endurance    Status Achieved      PT LONG TERM GOAL #2   Title report decreased frequency and intensity of spasms by  at least 50% for improved mobility    Status Achieved      PT LONG TERM GOAL #3   Title report pain < 4/10 for improved function    Status Achieved                 Plan - 06/20/20 0926    Clinical Impression Statement Pt has met all goals and overall feels ready for d/c.  Will plan to hold PT x 30 days and will d/c if he doesn't return.    Personal Factors and Comorbidities Comorbidity 2;Past/Current Experience;Time since onset of injury/illness/exacerbation    Comorbidities eosinophilic esophagties, HTN    Examination-Activity Limitations Lift;Carry;Reach Overhead;Stairs;Locomotion Level;Transfers    Examination-Participation Restrictions Yard Work;Occupation    Stability/Clinical Decision Making Evolving/Moderate complexity    Rehab Potential Good    PT Frequency 1x / week    PT Duration 6 weeks    PT Treatment/Interventions ADLs/Self Care Home Management;Cryotherapy;Electrical Stimulation;Functional mobility training;Therapeutic activities;Therapeutic exercise;Patient/family  education;Neuromuscular re-education;Manual techniques;Taping;Dry needling;Passive range of motion    PT Next Visit Plan hold x 30 days, d/c v/s recert    PT Home Exercise Plan pigeon foleded pose    Consulted and Agree with Plan of Care Patient           Patient will benefit from skilled therapeutic intervention in order to improve the following deficits and impairments:  Increased fascial restricitons,Pain,Postural dysfunction,Increased muscle spasms,Impaired tone,Decreased range of motion,Decreased strength  Visit Diagnosis: Acute midline low back pain without sciatica  Other symptoms and signs involving the musculoskeletal system     Problem List Patient Active Problem List   Diagnosis Date Noted   Hyperlipidemia 05/11/2017   HTN (hypertension) 06/02/2016   Testicular discomfort 13/24/4010   Eosinophilic esophagitis 27/25/3664   Onychomycosis 03/19/2015   Tinea pedis of both feet 02/18/2015      Laureen Abrahams, PT, DPT 06/20/20 9:28 AM    [facility] [address] [place], [place], [address] Phone: [phone]   Fax:  [phone]  Name: Shedric Fredericks MRN: [id number] Date of Birth: 15-Jul-1980     PHYSICAL THERAPY DISCHARGE SUMMARY  Visits from Start of Care: 4  Current functional level related to goals / functional outcomes: See above   Remaining deficits: See above   Education / Equipment: HEP, DN  Plan: Patient agrees to discharge.  Patient goals were met. Patient is being discharged due to meeting the stated rehab goals.  ?????     Laureen Abrahams, PT, DPT 08/30/20 11:38 AM  [facility] Physical Therapy [address] [place], [place], [address] Phone: [phone]   Fax:  [phone]

## 2022-04-09 IMAGING — DX DG FINGER THUMB 2+V*R*
3 series · 3 of 3 positions shown · non-contrast
Comparison: None.

CLINICAL DATA: Thumb injury

EXAM:
RIGHT THUMB 2+V

[finger ap]
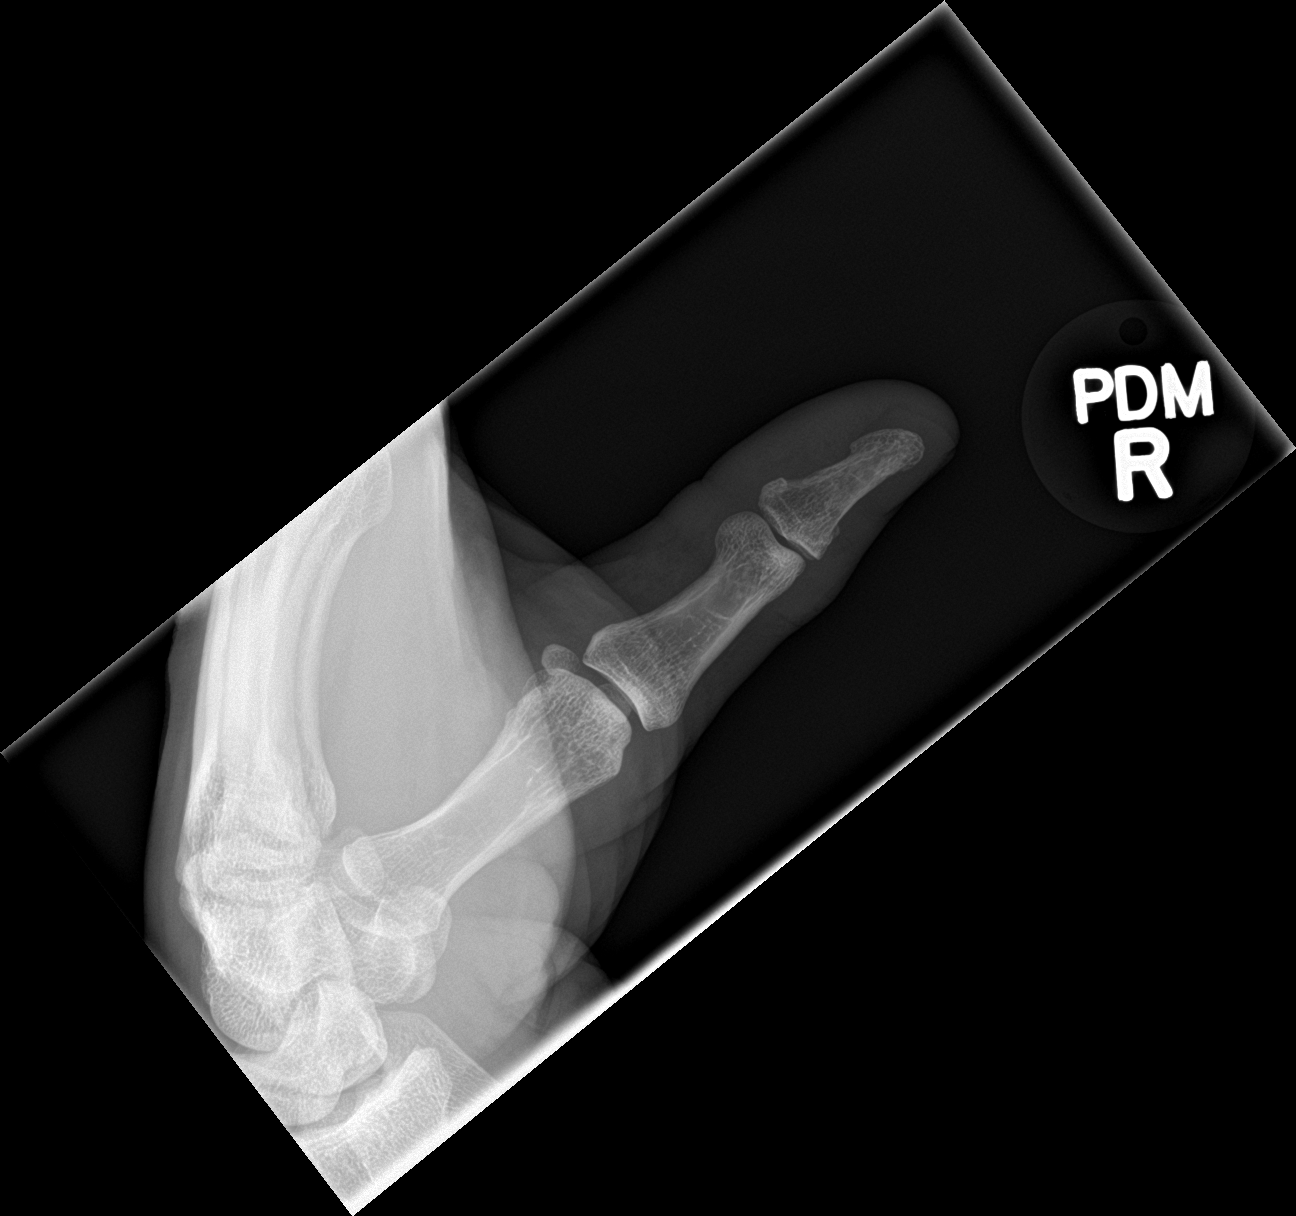

[finger obl]
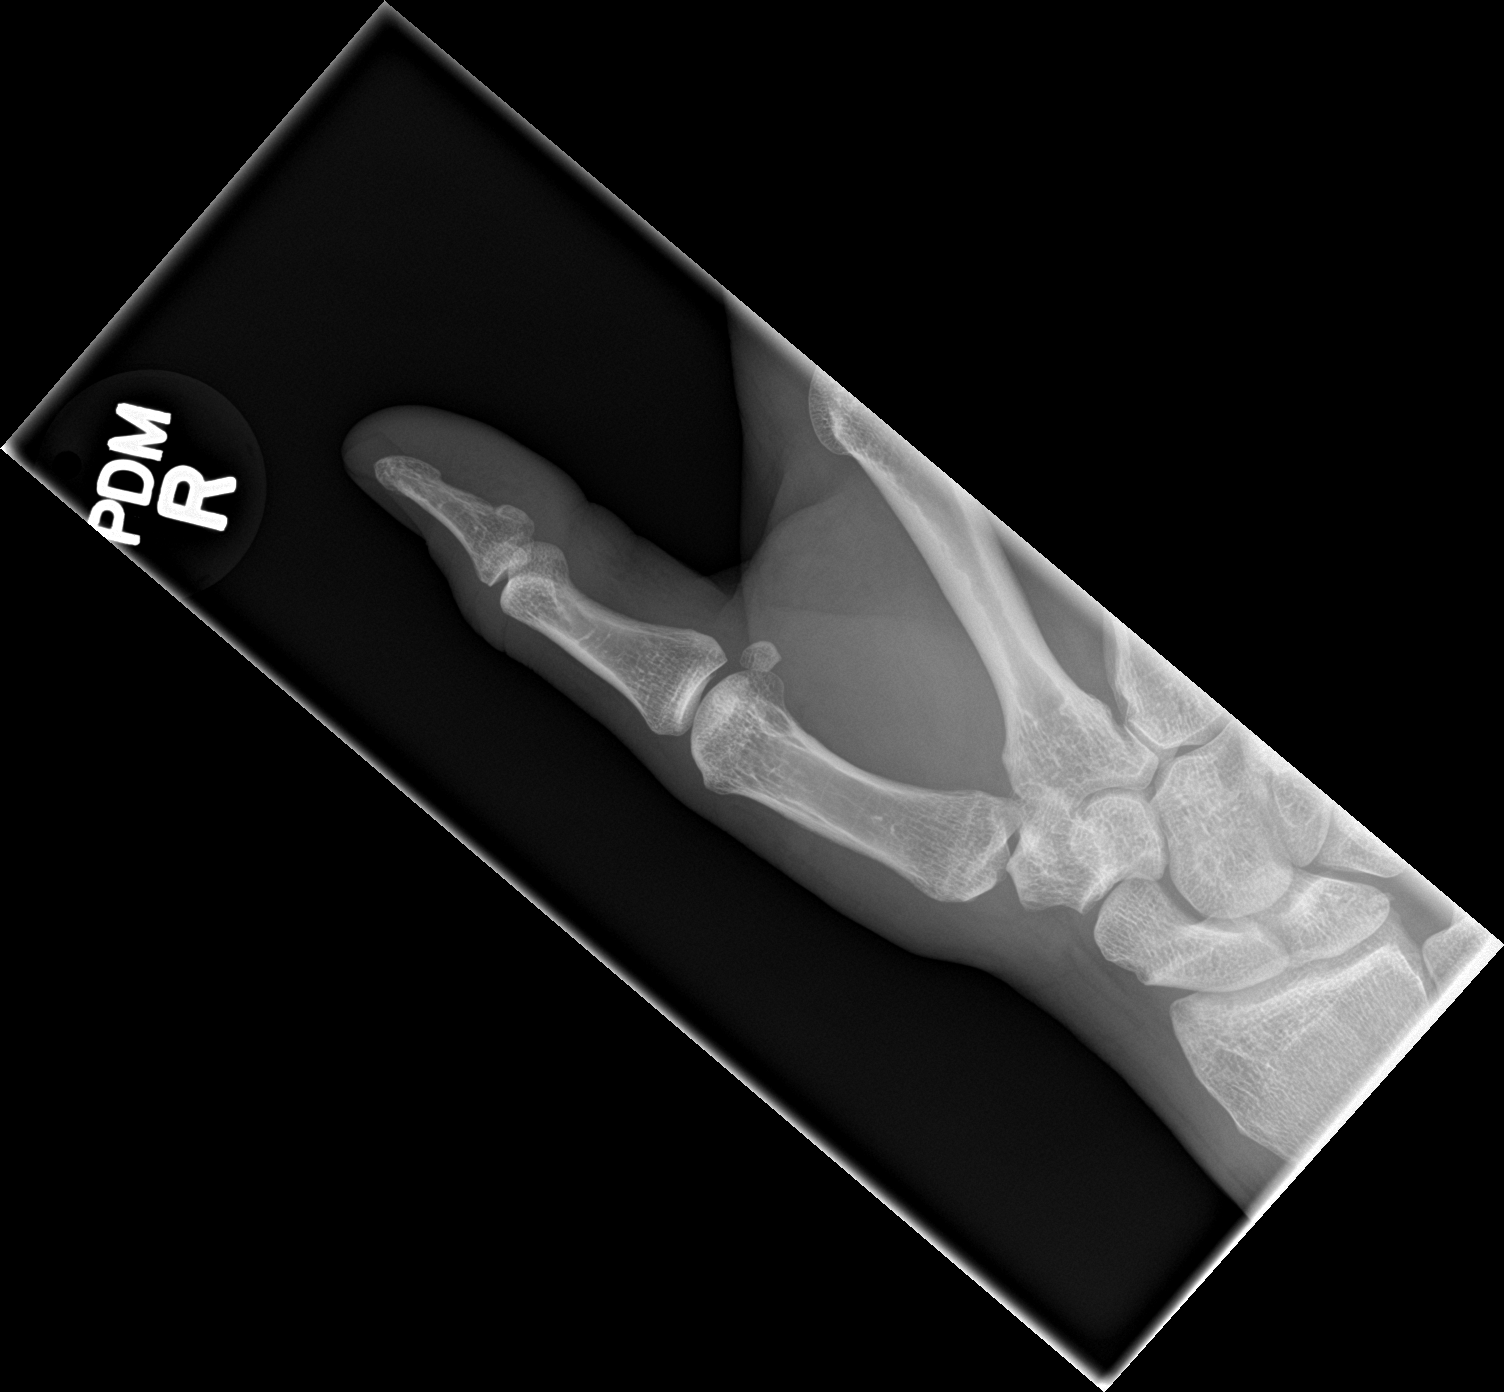

[finger lat]
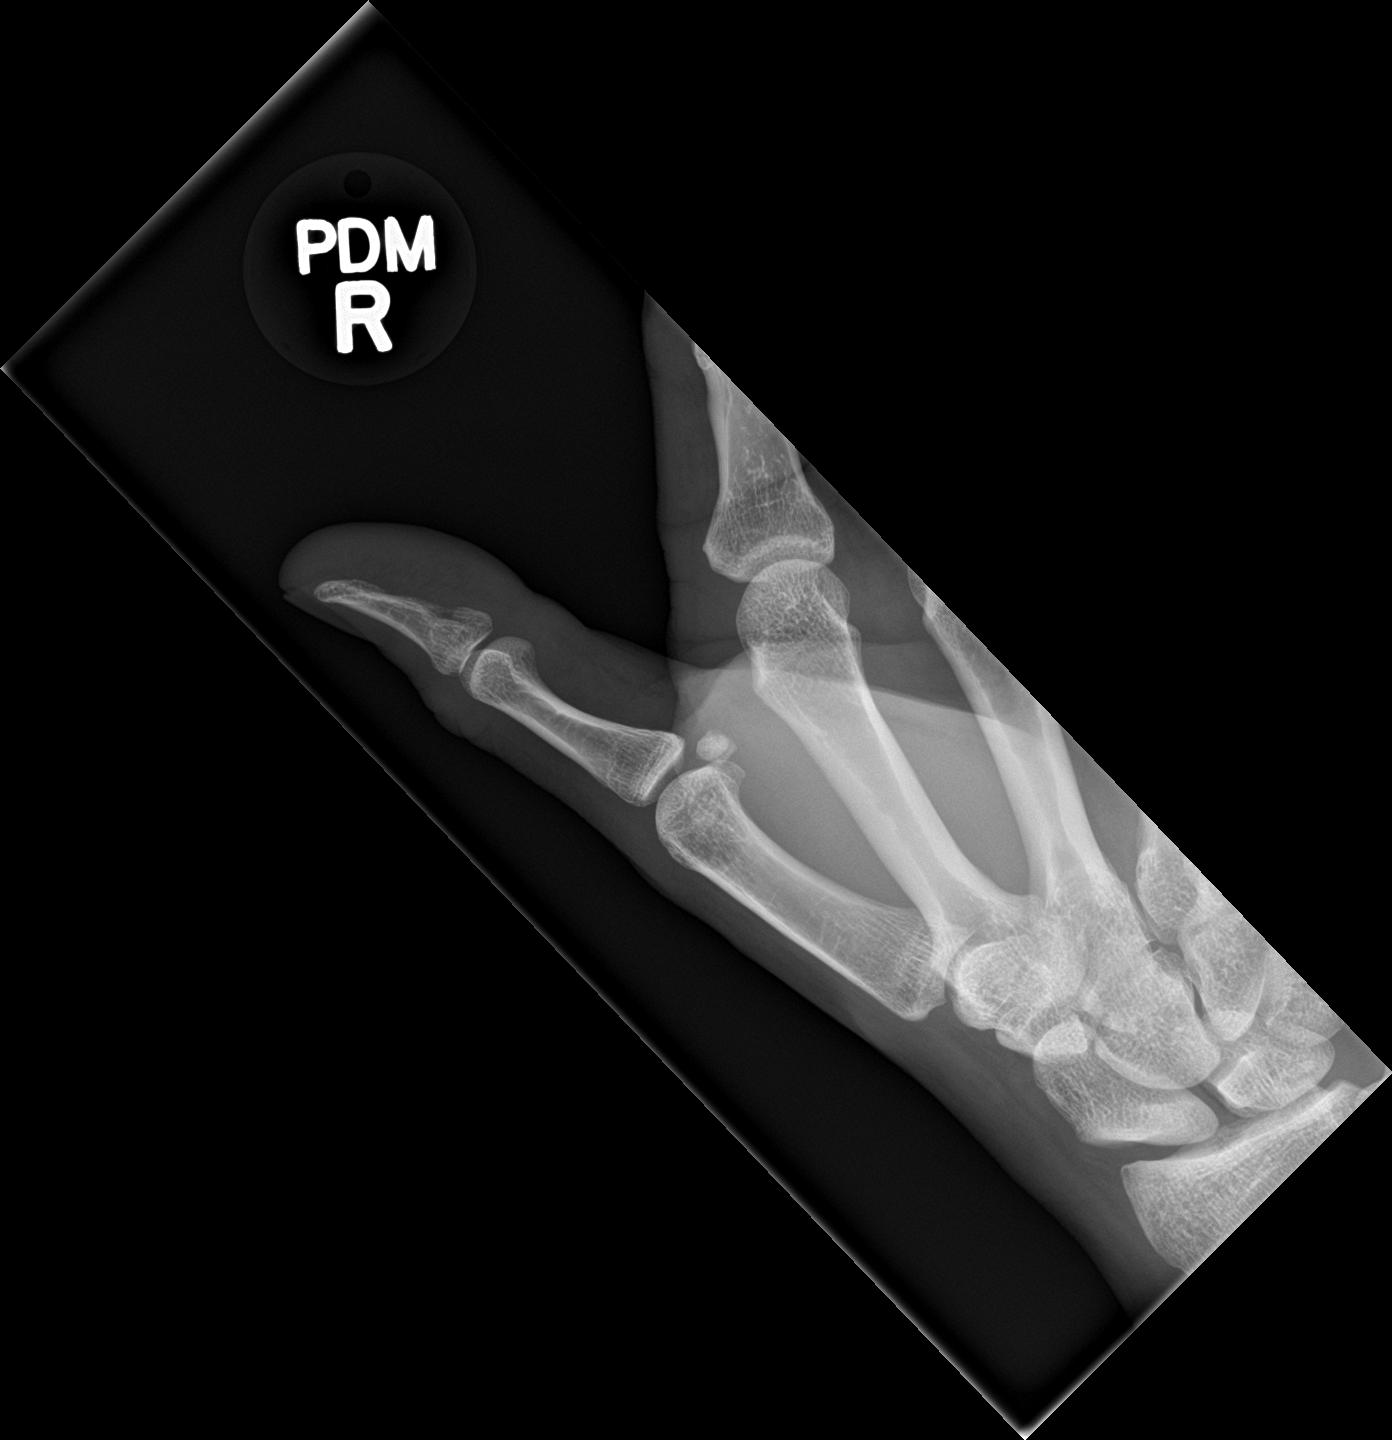

[3 of 3 positions shown; findings below may reference images not displayed]

FINDINGS: Alignment is anatomic. No acute fracture. Joint spaces are
preserved.
IMPRESSION: No acute osseous abnormality.
